# Patient Record
Sex: Male | Born: 1940 | Race: White | Hispanic: No | Marital: Married | Smoking: Former smoker
Health system: Southern US, Community
[De-identification: ages and names within clinical notes are randomized; demographics above are authoritative.]

## PROBLEM LIST (undated history)

## (undated) DIAGNOSIS — K279 Peptic ulcer, site unspecified, unspecified as acute or chronic, without hemorrhage or perforation: Secondary | ICD-10-CM

## (undated) DIAGNOSIS — K37 Unspecified appendicitis: Secondary | ICD-10-CM

## (undated) DIAGNOSIS — K649 Unspecified hemorrhoids: Secondary | ICD-10-CM

## (undated) DIAGNOSIS — I10 Essential (primary) hypertension: Secondary | ICD-10-CM

## (undated) DIAGNOSIS — N200 Calculus of kidney: Secondary | ICD-10-CM

## (undated) DIAGNOSIS — K5790 Diverticulosis of intestine, part unspecified, without perforation or abscess without bleeding: Secondary | ICD-10-CM

## (undated) DIAGNOSIS — H9190 Unspecified hearing loss, unspecified ear: Secondary | ICD-10-CM

## (undated) DIAGNOSIS — T7840XA Allergy, unspecified, initial encounter: Secondary | ICD-10-CM

## (undated) DIAGNOSIS — E785 Hyperlipidemia, unspecified: Secondary | ICD-10-CM

## (undated) DIAGNOSIS — R0600 Dyspnea, unspecified: Secondary | ICD-10-CM

## (undated) DIAGNOSIS — Z974 Presence of external hearing-aid: Secondary | ICD-10-CM

## (undated) DIAGNOSIS — K8020 Calculus of gallbladder without cholecystitis without obstruction: Secondary | ICD-10-CM

## (undated) DIAGNOSIS — I452 Bifascicular block: Secondary | ICD-10-CM

## (undated) DIAGNOSIS — K819 Cholecystitis, unspecified: Secondary | ICD-10-CM

## (undated) DIAGNOSIS — K449 Diaphragmatic hernia without obstruction or gangrene: Secondary | ICD-10-CM

## (undated) DIAGNOSIS — B9681 Helicobacter pylori [H. pylori] as the cause of diseases classified elsewhere: Secondary | ICD-10-CM

## (undated) DIAGNOSIS — K219 Gastro-esophageal reflux disease without esophagitis: Secondary | ICD-10-CM

## (undated) HISTORY — DX: Unspecified hemorrhoids: K64.9

## (undated) HISTORY — DX: Peptic ulcer, site unspecified, unspecified as acute or chronic, without hemorrhage or perforation: K27.9

## (undated) HISTORY — PX: APPENDECTOMY: SHX54

## (undated) HISTORY — DX: Calculus of gallbladder without cholecystitis without obstruction: K80.20

## (undated) HISTORY — PX: UMBILICAL HERNIA REPAIR: SHX196

## (undated) HISTORY — DX: Cholecystitis, unspecified: K81.9

## (undated) HISTORY — DX: Allergy, unspecified, initial encounter: T78.40XA

## (undated) HISTORY — PX: INGUINAL HERNIA REPAIR: SUR1180

## (undated) HISTORY — PX: KIDNEY STONE SURGERY: SHX686

## (undated) HISTORY — PX: CHOLECYSTECTOMY: SHX55

## (undated) HISTORY — DX: Essential (primary) hypertension: I10

## (undated) HISTORY — PX: COLONOSCOPY: SHX174

## (undated) HISTORY — DX: Calculus of kidney: N20.0

## (undated) HISTORY — DX: Helicobacter pylori (H. pylori) as the cause of diseases classified elsewhere: B96.81

## (undated) HISTORY — DX: Gastro-esophageal reflux disease without esophagitis: K21.9

## (undated) HISTORY — DX: Unspecified appendicitis: K37

## (undated) HISTORY — DX: Diaphragmatic hernia without obstruction or gangrene: K44.9

## (undated) HISTORY — DX: Diverticulosis of intestine, part unspecified, without perforation or abscess without bleeding: K57.90

## (undated) HISTORY — DX: Hyperlipidemia, unspecified: E78.5

---

## 1999-03-30 ENCOUNTER — Emergency Department (HOSPITAL_COMMUNITY): Admission: EM | Admit: 1999-03-30 | Discharge: 1999-03-30 | Payer: Self-pay | Admitting: Emergency Medicine

## 1999-03-30 ENCOUNTER — Encounter: Payer: Self-pay | Admitting: Emergency Medicine

## 1999-04-03 ENCOUNTER — Ambulatory Visit (HOSPITAL_COMMUNITY): Admission: RE | Admit: 1999-04-03 | Discharge: 1999-04-03 | Payer: Self-pay | Admitting: Urology

## 1999-04-03 ENCOUNTER — Encounter: Payer: Self-pay | Admitting: Urology

## 2000-02-03 ENCOUNTER — Other Ambulatory Visit: Admission: RE | Admit: 2000-02-03 | Discharge: 2000-02-03 | Payer: Self-pay | Admitting: Otolaryngology

## 2000-02-03 ENCOUNTER — Encounter (INDEPENDENT_AMBULATORY_CARE_PROVIDER_SITE_OTHER): Payer: Self-pay

## 2000-02-21 ENCOUNTER — Encounter: Admission: RE | Admit: 2000-02-21 | Discharge: 2000-02-21 | Payer: Self-pay | Admitting: Urology

## 2000-02-21 ENCOUNTER — Encounter: Payer: Self-pay | Admitting: Urology

## 2006-06-22 ENCOUNTER — Encounter: Admission: RE | Admit: 2006-06-22 | Discharge: 2006-06-22 | Payer: Self-pay | Admitting: Internal Medicine

## 2006-07-07 ENCOUNTER — Ambulatory Visit: Payer: Self-pay | Admitting: Internal Medicine

## 2006-07-29 ENCOUNTER — Ambulatory Visit: Payer: Self-pay | Admitting: Oncology

## 2006-08-06 LAB — COMPREHENSIVE METABOLIC PANEL
ALT: 26 U/L (ref 0–40)
Albumin: 4.2 g/dL (ref 3.5–5.2)
Alkaline Phosphatase: 86 U/L (ref 39–117)
Glucose, Bld: 127 mg/dL — ABNORMAL HIGH (ref 70–99)
Potassium: 4.3 mEq/L (ref 3.5–5.3)
Sodium: 139 mEq/L (ref 135–145)
Total Bilirubin: 0.8 mg/dL (ref 0.3–1.2)
Total Protein: 6.9 g/dL (ref 6.0–8.3)

## 2006-08-06 LAB — CBC WITH DIFFERENTIAL/PLATELET
Basophils Absolute: 0 10*3/uL (ref 0.0–0.1)
EOS%: 1.1 % (ref 0.0–7.0)
Eosinophils Absolute: 0.1 10*3/uL (ref 0.0–0.5)
HGB: 14.8 g/dL (ref 13.0–17.1)
MCH: 30.4 pg (ref 28.0–33.4)
MCV: 89.5 fL (ref 81.6–98.0)
MONO%: 7.8 % (ref 0.0–13.0)
NEUT#: 4.9 10*3/uL (ref 1.5–6.5)
RBC: 4.87 10*6/uL (ref 4.20–5.71)
RDW: 13.3 % (ref 11.2–14.6)
lymph#: 1.8 10*3/uL (ref 0.9–3.3)

## 2006-08-06 LAB — CHCC SMEAR

## 2006-08-07 ENCOUNTER — Ambulatory Visit (HOSPITAL_COMMUNITY): Admission: RE | Admit: 2006-08-07 | Discharge: 2006-08-07 | Payer: Self-pay | Admitting: Oncology

## 2006-08-11 ENCOUNTER — Ambulatory Visit: Payer: Self-pay | Admitting: Internal Medicine

## 2006-09-04 ENCOUNTER — Emergency Department (HOSPITAL_COMMUNITY): Admission: EM | Admit: 2006-09-04 | Discharge: 2006-09-05 | Payer: Self-pay | Admitting: Emergency Medicine

## 2006-09-11 LAB — CBC WITH DIFFERENTIAL/PLATELET
BASO%: 0.4 % (ref 0.0–2.0)
EOS%: 1.4 % (ref 0.0–7.0)
MCH: 30.8 pg (ref 28.0–33.4)
MCHC: 34.6 g/dL (ref 32.0–35.9)
MONO#: 0.5 10*3/uL (ref 0.1–0.9)
RBC: 4.8 10*6/uL (ref 4.20–5.71)
WBC: 7.1 10*3/uL (ref 4.0–10.0)
lymph#: 1.9 10*3/uL (ref 0.9–3.3)

## 2006-09-11 LAB — CHCC SMEAR

## 2006-10-07 ENCOUNTER — Encounter (INDEPENDENT_AMBULATORY_CARE_PROVIDER_SITE_OTHER): Payer: Self-pay | Admitting: Specialist

## 2006-10-07 ENCOUNTER — Ambulatory Visit (HOSPITAL_COMMUNITY): Admission: RE | Admit: 2006-10-07 | Discharge: 2006-10-07 | Payer: Self-pay | Admitting: General Surgery

## 2006-10-09 ENCOUNTER — Ambulatory Visit (HOSPITAL_COMMUNITY): Admission: RE | Admit: 2006-10-09 | Discharge: 2006-10-09 | Payer: Self-pay | Admitting: Gastroenterology

## 2007-08-30 ENCOUNTER — Ambulatory Visit (HOSPITAL_COMMUNITY): Admission: RE | Admit: 2007-08-30 | Discharge: 2007-08-30 | Payer: Self-pay | Admitting: Internal Medicine

## 2009-12-14 ENCOUNTER — Ambulatory Visit (HOSPITAL_BASED_OUTPATIENT_CLINIC_OR_DEPARTMENT_OTHER): Admission: RE | Admit: 2009-12-14 | Discharge: 2009-12-14 | Payer: Self-pay | Admitting: General Surgery

## 2010-09-18 ENCOUNTER — Ambulatory Visit (HOSPITAL_COMMUNITY): Admission: RE | Admit: 2010-09-18 | Discharge: 2010-09-20 | Payer: Self-pay | Admitting: General Surgery

## 2010-12-22 ENCOUNTER — Encounter: Payer: Self-pay | Admitting: Gastroenterology

## 2011-02-12 LAB — CBC
HCT: 44.1 % (ref 39.0–52.0)
Platelets: 230 10*3/uL (ref 150–400)
RDW: 13.2 % (ref 11.5–15.5)
WBC: 7.6 10*3/uL (ref 4.0–10.5)

## 2011-02-12 LAB — BASIC METABOLIC PANEL
BUN: 12 mg/dL (ref 6–23)
Calcium: 9.1 mg/dL (ref 8.4–10.5)
GFR calc non Af Amer: >60 mL/min (ref >60)
Potassium: 4.4 mEq/L (ref 3.5–5.1)

## 2011-02-12 LAB — SURGICAL PCR SCREEN
MRSA, PCR: NEGATIVE
Staphylococcus aureus: NEGATIVE

## 2011-02-16 LAB — POCT HEMOGLOBIN-HEMACUE: Hemoglobin: 15.8 g/dL (ref 13.0–17.0)

## 2011-02-16 LAB — BASIC METABOLIC PANEL
BUN: 11 mg/dL (ref 6–23)
Calcium: 9.2 mg/dL (ref 8.4–10.5)
Creatinine, Ser: 0.81 mg/dL (ref 0.4–1.5)
GFR calc Af Amer: >60 mL/min (ref >60)
GFR calc non Af Amer: >60 mL/min (ref >60)

## 2011-04-18 NOTE — Op Note (Signed)
NAME:  [name], [name]               ACCOUNT NO.:  [id number]   MEDICAL RECORD NO.:  [id number]          PATIENT TYPE:  AMB   LOCATION:  SDS                          FACILITY:  [facility]   PHYSICIAN:  Leonie Man, M.D.   DATE OF BIRTH:  Oct 24, 1941   DATE OF PROCEDURE:  10/07/2006  DATE OF DISCHARGE:                                 OPERATIVE REPORT   INCOMPLETE:   PREOPERATIVE DIAGNOSIS:  Chronic calculous cholecystitis.   POSTOPERATIVE DIAGNOSIS:  Chronic calculous cholecystitis.   PROCEDURE:  Laparoscopic cholecystectomy with intraoperative cholangiogram.   SURGEON:  Leonie Man, M.D.   ASSISTANT:  Anselm Pancoast. Zachery Dakins, M.D.   ANESTHESIA:  General.   SPECIMENS:  To the laboratory gallbladder with stones.   ESTIMATED BLOOD LOSS:  The estimated blood loss was minimal.   COMPLICATIONS:  None.  The patient returned to the PACU in excellent  condition.   INDICATIONS:  The patient is a 70 year old man presenting with upper  abdominal pain, associated with nausea and vomiting.  He presented through  the emergency room and underwent a complete cardiac workup which was  negative.  Subsequent gallbladder ultrasound showed a cholelithiasis.  His  liver function studies at that time were moderately elevated.  They have  since decreased towards normal; however, they are still not completely  normal.  His most recent laboratory evaluations show an elevation in his  SGOT to 39, SGPT is normal, alkaline phosphatase is 119 and total bilirubin  is 1.3.  Since his original attack, the patient has not had any other  symptoms of gallbladder disease.  He comes to the operating room now for a  laparoscopic cholecystectomy and intraoperative cholangiogram.  The risks  and potential benefits of surgery have been fully discussed with him and all  questions answered.  A consent obtained.      Leonie Man, M.D.  Electronically Signed     PB/MEDQ  D:  10/07/2006  T:  10/07/2006  Job:   [id number]

## 2011-04-18 NOTE — Consult Note (Signed)
NAME:  [name], [name]               ACCOUNT NO.:  [id number]   MEDICAL RECORD NO.:  [id number]          PATIENT TYPE:  AMB   LOCATION:  SDS                          FACILITY:  [facility]   PHYSICIAN:  Jordan Hawks. Elnoria Howard, MD    DATE OF BIRTH:  June 29, 1941   DATE OF CONSULTATION:  10/07/2006  DATE OF DISCHARGE:  10/07/2006                                 CONSULTATION   REFERRING PHYSICIAN:  Leonie Man, M.D.   HISTORY OF PRESENT ILLNESS:  This is a 70 year old gentleman who is  status post laparoscopic cholecystectomy on October 07, 2006, for  symptomatic cholelithiasis. The patient previously presented to the  emergency room and in October 2007 with complaints of epigastric pain  with radiation to his right shoulder. At that time, his liver panel was  noted to have an elevation in his AST at 279, ALT at 246 and alkaline  phosphatase of 125.  Additionally, his bilirubin was elevated at 2.1.  Since that event in the emergency room, he has not had any further  evidence of abdominal pain. An ultrasound was performed while he was in  the emergency, and there was no evidence of acute cholecystitis at that  time. However, it was consistent with cholelithiasis. He subsequently  underwent an uncomplicated laparoscopic cholecystectomy by Dr. Lurene Shadow,  and he is currently recovering at this time.  During the procedure, an  intraoperative cholangiogram was performed and did reveal a dilated CBD  as well as some sludge and a possible filling defect in the distal CBD.  There was contrast that did not extrude out into the duodenum which is  normal.  Subsequently, a GI consultation was requested for further  evaluation and treatment.   PAST MEDICAL HISTORY:  Is significant for:  1. Hypertension.  2. Hyperlipidemia.   ALLERGIES:  Are to PENICILLIN.   PRIOR SURGERIES:  1. Appendectomy.  2. Renal stones.  3. Hernia repair.   FAMILY HISTORY:  Is noncontributory.   SOCIAL HISTORY:  Is negative for  alcohol, tobacco or illicit drug use.  The patient is married.   REVIEW OF SYSTEMS:  Negative for nausea, vomiting, fevers, chills,  dysphagia, dysarthria, shortness of breath, chest pain, arthritis,  arthralgias, urinary difficulties, diarrhea, constipation, dizziness,  vertigo.   MEDICATIONS:  Benicar, Effexor, fenadine, and simvastatin.   PHYSICAL EXAMINATION:  VITAL SIGNS:  Stable.  GENERAL:  The patient is in no acute distress, alert and oriented.  HEENT:  Normocephalic, atraumatic.  Extraocular muscles intact.  Pupils  equal, round, reactive to light.  LUNGS:  Clear to auscultation bilaterally.  CARDIOVASCULAR:  Regular rate and rhythm.  ABDOMEN: Flat, soft, tender at the incision sites.  EXTREMITIES:  No clubbing, cyanosis or edema.   LABORATORY VALUES:  On October 02, 2006, white blood cell count is 7.2,  hemoglobin 15.4, MCV  90.4, platelets at 389. PT is 13.9, INR 1.9, PTT  33.  Sodium 139, potassium 5.2, chloride 103, CO2 29, glucose 79, BUN  11, creatinine 1.1, total bilirubin 1.3, alkaline phosphatase 119, AST  39, ALT 30, albumin 4.1.   IMPRESSION:  1. Probable  choledocholithiasis.  2. Status post laparoscopic cholecystectomy.  3. Hypertension.  4. Recent history of thrombocytopenia which appears to be stable at      this time.   After evaluation of the patient, it does appear that his transaminases  are improving and almost normalized. At this time, I do not feel that  there is an emergent need for an ERCP as the risks would outweigh the  benefit. There is a probable stone in the distal CBD, and it appears to  be small, and it may be that he can pass this stone spontaneously  without having to undergo an ERCP procedure.   At this time, I will have him follow up in the office tomorrow for check  of his liver panel. If  there is a persistent elevation in his bilirubin  and alkaline phosphatase, I will proceed with ERCP on Friday. If there  is normalization or  continued trend towards normalization, I will hold  off on ERCP and have the patient follow up at a later date in the office  as well as a right upper quadrant ultrasound to discern if there is any  continued dilation of his common bile duct.  I have informed the patient  to contact me should he have any recurrence or worsening of his  epigastric pain, nausea, vomiting and certainly fever of greater than  101.      Jordan Hawks Elnoria Howard, MD  Electronically Signed     PDH/MEDQ  D:  10/07/2006  T:  10/07/2006  Job:  [id number]   cc:   Leonie Man, M.D.  Larina Earthly, M.D.

## 2011-04-18 NOTE — Assessment & Plan Note (Signed)
[facility]                           GASTROENTEROLOGY OFFICE NOTE   NAME:[name], [name]                      MRN:          [id number]  DATE:07/07/2006                            DOB:          [birth year]    REFERRING PHYSICIAN:  Larina Earthly, MD   REASON FOR CONSULTATION:  Abdominal pain.   HISTORY:  This is a 70 year old white male with a history of hyperlipidemia  who is referred through the courtesy of Dr. Felipa Eth regarding abdominal pain.  The patient reports having developed problems with epigastric pain  approximately 3 weeks ago.  He described the pain as occurring only at  night. He described indigestion.  It was most prominent in the epigastric  region with some radiation bilaterally in the upper portion of the abdomen.  Occasional pyrosis was present.  No nausea, vomiting, or dysphagia.  He  seemed to be OK in the morning and during the day.  It was not clear that  food affected his discomfort, though he would not eat at night after  developing the pain as he was lying down to rest.  He was evaluated in Dr.  Vicente Males office and had blood work.  CBC was normal except for mildly  diminished platelets.  Comprehensive metabolic panel was normal including  liver function tests.  Amylase and Lipase were normal.  He did, however,  have a positive Helicobacter pylori antibody for which he was treated with  b.i.d. Biaxin, Flagyl, and Protonix for 2 weeks.  After completing therapy  the patient reports that his symptoms have entirely resolved.  His only  active GI complaint now was occasional rectal itching with some minor rectal  bleeding last noticed a year ago.  He does report a remote history of ulcers  diagnosed by symptoms about 20 years ago.  He has not had prior endoscopy or  screening colonoscopy.  The latter was recommended by his physician.   PAST MEDICAL HISTORY:  1.  Hyperlipidemia.  2.  Remote history of ulcers.  3.  Kidney stones.   PAST SURGICAL HISTORY:  1.  Left inguinal hernia surgery.  2.  Appendectomy for ruptured appendix 10 years ago.   ALLERGIES:  PENICILLIN.   CURRENT MEDICATIONS:  1.  Aspirin 81 mg daily.  2.  Benicar 40 mg daily.  3  Allegra 180 mg daily.  1.  Simvastatin 40 mg daily.  2.  Ibuprofen p.r.n.   FAMILY HISTORY:  No family history of gastrointestinal malignancy.   SOCIAL HISTORY:  The patient is married with 2 children; lives with his  wife.  He is retired form the [employer] as well as [employer].  He does not smoke or use alcohol.   REVIEW OF SYSTEMS:  Per diagnostic evaluation form.   PHYSICAL EXAMINATION:  GENERAL:  A well-appearing male in no acute distress.  VITAL SIGNS:  Blood pressure 124/70, heart rate 68.  Weight is 205 pounds.  He is 5 feet 10 inches in height.  HEENT:  Sclerae anicteric.  Conjunctivae are pink.  Oral mucosa is intact.  NECK:  No adenopathy.  LUNGS:  Clear.  HEART:  Regular.  ABDOMEN:  Obese and soft without tenderness, mass or obvious hernia.  Good  bowel sounds are heard.  EXTREMITIES:  Without edema.   IMPRESSION:  1.  This is a 70 year old gentleman with recent problems with epigastric      pain.  Symptoms resolved after treatment for Helicobacter pylori.  Given      his remote history of ulcers, suspect recurrence of the same.  2.  Colon cancer screening.  The patient is baseline risk.  He is an      appropriate candidate without contraindication.   RECOMMENDATIONS:  Colonoscopy for colon cancer screening and upper endoscopy  to evaluate epigastric pain and probable ulcer.  The nature of the procedure  as well as the risks, benefits, and alternatives were discussed in detail.  He understood and agreed to proceed.  If his endoscopy is unrevealing we  should perform biopsies to assure irradiation of H. pylori.                                   [name]. Eda Keys., MD   JNP/MedQ  DD:  07/08/2006  DT:  07/08/2006  Job #:   [id number]   cc:   Larina Earthly, MD

## 2011-04-18 NOTE — Op Note (Signed)
NAME:  [name], [name]               ACCOUNT NO.:  [id number]   MEDICAL RECORD NO.:  [id number]          PATIENT TYPE:  AMB   LOCATION:  SDS                          FACILITY:  [facility]   PHYSICIAN:  Leonie Man, M.D.   DATE OF BIRTH:  [birth year]   DATE OF PROCEDURE:  10/07/2006  DATE OF DISCHARGE:                                 OPERATIVE REPORT   PREOPERATIVE DIAGNOSIS:  Chronic calculous cholecystitis.   POSTOPERATIVE DIAGNOSIS:  Chronic calculous cholecystitis with  choledocholithiasis.   PROCEDURE:  Laparoscopic cholecystectomy with intraoperative cholangiogram   SURGEON:  Leonie Man, M.D.   ASSISTANT:  Anselm Pancoast. Zachery Dakins, M.D.   ANESTHESIA:  General.   SPECIMENS TO THE LAB:  Gallbladder with stones.   ESTIMATED BLOOD LOSS:  Minimal.   COMPLICATIONS:  None.   The patient returned to the PACU in excellent condition.   INDICATIONS FOR SURGERY:  The patient is a 70 year old gentleman who  originally presented through the emergency room with severe epigastric pain  which radiated to his right shoulder and back.  This associated with nausea  and vomiting.  He underwent a cardiac workup which was negative.  Abdominal  ultrasound demonstrated cholelithiasis.  His liver function studies at the  time of evaluation showed a total bilirubin of 2.1 and alkaline phosphatase  of 125.  His SGOT and SGPT where 279 and 246, respectively.  Since the time  of his first attack he has not had any continued gallbladder symptoms.  His  liver function studies have returned toward normal with a SGOT of 39, SGPT  of 30, alkaline phosphatase 119, bilirubin is 1.3.  The patient scheduled  and brought to the operating room now for laparoscopic cholecystectomy with  intraoperative cholangiogram.  The risks and potential benefits of surgery  have been fully discussed.  All questions answered and consent obtained for  surgery.   Following induction of satisfactory general anesthesia, with the  patient  positioned supinely, the abdomen is routinely prepped and draped to be  included in a sterile operative field.  Open laparoscopy is created at the  umbilicus with insertion of a Hassan cannula, insufflation of the peritoneal  cavity to 14 mmHg pressure.  This was done with carbon dioxide.  Camera was  inserted and visual exploration of the abdomen carried out.  There were  multiple adhesions in the lower abdomen from previous appendectomy.  In the  upper abdomen the gallbladder was noted to be chronically scarred with  multiple adhesions to the wall of the gallbladder, anterior gastric wall and  duodenal sweep were somewhat tethered to the gallbladder.  Liver edges were  sharp.  Liver surface was smooth.  The small or large intestine visualized  and appeared to be otherwise abnormal.   Under direct vision, epigastric and lateral ports were placed.  The  gallbladder was grasped and retracted cephalad and dissection carried out so  as to remove the adhesions from the gallbladder, carrying the dissection  down to the ampulla.  In the region of the ampulla the cystic artery and  cystic duct were dissected free and visualized.  The cystic duct was traced  up to its entry into the gallbladder wall and the cystic artery dissected  free and traced up to the gallbladder also.  The cystic arteries were then  doubly clipped and transected.  The cystic duct was clipped proximally and  opened.  The cystic duct cholangiogram was accomplished by passing a Cook  catheter into the abdomen and into the cystic duct through which a one half-  strength Hypaque was injected under fluoroscopic control.  The resulting  cholangiogram showed prompt flow of contrast into the duodenum, however, at  the distal common bile duct there appeared to be some sludge and debris.  There was  normal tapering of the distal common bile duct.  The cystic duct  catheter was then removed and the cystic duct was triply  clipped and  transected.  The gallbladder was dissected free from the liver bed using  electrocautery and maintaining hemostasis throughout the entire course of  the dissection.  At the end of dissection, the gallbladder was placed on  Endopouch and retrieved through the umbilical wound without difficulty.  All  areas of dissection were checked for hemostasis and noted to be dry.  Additional bleeding points within the visual bed were treated with  electrocautery.  The right upper quadrant was then thoroughly irrigated with  normal saline and aspirated.  Sponge, instrument and sharp counts were  verified.  Pneumoperitoneum was allowed to deflate after the trocars were  removed under direct vision.  Wounds were closed in layers as follows.  The  umbilical wound in two layers with 0 Dexon and 4-0 Monocryl, epigastric and  flank wounds were closed with 4-0 Monocryl sutures, all incisions were then  reinforced with Steri-Strips.  Sterile dressings were applied.  The  anesthetic reversed and the patient removed from the operating room to the  recovery room in stable condition.  He tolerated the procedure well.      Leonie Man, M.D.  Electronically Signed     PB/MEDQ  D:  10/07/2006  T:  10/07/2006  Job:  [id number]   cc:   Larina Earthly, M.D.

## 2011-11-28 ENCOUNTER — Other Ambulatory Visit: Payer: Self-pay | Admitting: Dermatology

## 2012-08-09 ENCOUNTER — Emergency Department (HOSPITAL_COMMUNITY)
Admission: EM | Admit: 2012-08-09 | Discharge: 2012-08-09 | Disposition: A | Discharge status: Home / Self Care | Payer: Medicare Other | Attending: Emergency Medicine | Admitting: Emergency Medicine

## 2012-08-09 ENCOUNTER — Encounter (HOSPITAL_COMMUNITY): Payer: Self-pay | Admitting: Physical Medicine and Rehabilitation

## 2012-08-09 DIAGNOSIS — IMO0002 Reserved for concepts with insufficient information to code with codable children: Secondary | ICD-10-CM

## 2012-08-09 DIAGNOSIS — S61509A Unspecified open wound of unspecified wrist, initial encounter: Secondary | ICD-10-CM | POA: Insufficient documentation

## 2012-08-09 DIAGNOSIS — Z880 Allergy status to penicillin: Secondary | ICD-10-CM | POA: Insufficient documentation

## 2012-08-09 DIAGNOSIS — W268XXA Contact with other sharp object(s), not elsewhere classified, initial encounter: Secondary | ICD-10-CM | POA: Insufficient documentation

## 2012-08-09 DIAGNOSIS — Z23 Encounter for immunization: Secondary | ICD-10-CM | POA: Insufficient documentation

## 2012-08-09 MED ORDER — BACITRACIN 500 UNIT/GM EX OINT: 1.0000 "application " | TOPICAL_OINTMENT | Freq: Two times a day (BID) | CUTANEOUS | Status: DC

## 2012-08-09 MED ORDER — TETANUS-DIPHTH-ACELL PERTUSSIS 5-2.5-18.5 LF-MCG/0.5 IM SUSP
0.5000 mL | Freq: Once | INTRAMUSCULAR | Status: AC
  Administered 2012-08-09: 0.5 mL via INTRAMUSCULAR
  Filled 2012-08-09 (×2): qty 0.5

## 2012-08-09 NOTE — ED Provider Notes (Signed)
History  This chart was scribed for Doug Sou, MD  by Albertha Ghee Rifaie. This patient was seen in room [facility] and the patient's care was started at 4:45PM  CSN: [id number]  Arrival date & time 08/09/12  1430   None     Chief Complaint  Patient presents with  . Laceration     The history is provided by the patient. No language interpreter was used.    [name] is a 71 y.o. male who presents to the Emergency Department complaining of lacerations to both wrists that occurred when a piece of sheet metal fell while he was [place] the woods about 30 minutes PTA.  He denies any pain currently. He denies fevers, nausea and chills as associated symptoms. TD is not UTD. Pt does not have a h/o chronic medical conditions. He denies smoking and alcohol use.  No past medical history on file. Hypertension No past surgical history on file.  History reviewed. No pertinent family history.  History  Substance Use Topics  . Smoking status: Never Smoker   . Smokeless tobacco: Not on file  . Alcohol Use: No      Review of Systems  Constitutional: Negative.   HENT: Negative.   Respiratory: Negative.   Cardiovascular: Negative.   Gastrointestinal: Negative.   Musculoskeletal: Negative.   Skin: Positive for wound.       Lacerations to right hand, abrasions to left forearm  Neurological: Negative.   Hematological: Negative.   Psychiatric/Behavioral: Negative.     Allergies  Penicillins  Home Medications   Current Outpatient Rx  Name Route Sig Dispense Refill  . VITAMIN C PO Oral Take 1 tablet by mouth daily.    . ASPIRIN EC 81 MG PO TBEC Oral Take 81 mg by mouth daily.    Marland Kitchen VITAMIN D PO Oral Take 1 tablet by mouth daily.    Marland Kitchen FLUTICASONE PROPIONATE 50 MCG/ACT NA SUSP Nasal Place 2 sprays into the nose daily as needed. For allergies.    Marland Kitchen OLMESARTAN MEDOXOMIL 20 MG PO TABS Oral Take 20 mg by mouth daily.    Marland Kitchen SIMVASTATIN 20 MG PO TABS Oral Take 20 mg by mouth every  evening.      Triage Vitals: BP 137/78  Pulse 116  Temp 98.3 F (36.8 C) (Oral)  Resp 16  SpO2 98%  Physical Exam  Nursing note and vitals reviewed. Constitutional: He appears well-developed and well-nourished.  HENT:  Head: Normocephalic and atraumatic.  Eyes: Conjunctivae are normal. Pupils are equal, round, and reactive to light.  Neck: Neck supple. No tracheal deviation present. No thyromegaly present.  Cardiovascular: Normal rate and regular rhythm.   No murmur heard. Pulmonary/Chest: Effort normal and breath sounds normal.  Abdominal: Soft. Bowel sounds are normal. He exhibits no distension. There is no tenderness.  Musculoskeletal: Normal range of motion. He exhibits no edema and no tenderness.       neurovascularly intact  Neurological: He is alert. Coordination normal.  Skin: Skin is warm and dry. No rash noted.       4 cm laceration to the dorsum of the right hand, dermis layer at the base, 2 cm laceration to the dorsum of the right hand, epidermis at base, 2 cm linear abrasion at the left middle arm, dime sized at the left dorsal wrist  Psychiatric: He has a normal mood and affect.    ED Course  Procedures (including critical care time)  DIAGNOSTIC STUDIES: Oxygen Saturation is 98% on room  air, normal by my interpretation.    COORDINATION OF CARE: 4:56PM-Discussed treatment plan with pt at bedside and pt agreed to plan.  LACERATION REPAIR Performed by: me Consent: Verbal consent obtained. Risks and benefits: risks, benefits and alternatives were discussed Patient identity confirmed: provided demographic data Time out performed prior to procedure Prepped and Draped in normal sterile fashion Wound explored Laceration Location: right wrist Laceration Length: 4cm No Foreign Bodies seen or palpated Anesthesia: local infiltration Local anesthetic: lidocaine 2% without epinephrine Anesthetic total: 2 ml Irrigation method: syringe Amount of cleaning:  standard Skin closure: 4-0 Prolene  Number of sutures or staples: For  Technique: Simple interrupted sutures  Patient tolerance: Patient tolerated the procedure well with no immediate complications.  5:10PM-Discussed discharge plan with pt at bedside and pt agreed to plan.  Labs Reviewed - No data to display No results found.   No diagnosis found.    MDM  2 cm laceration of dorsum of right hand did not require repair. Abrasions and 2 cm laceration right hand require local wound care only Plan bacitracin ointment wound checks 2 days sutures out in 7 days         I personally performed the services described in this documentation, which was scribed in my presence. The recorded information has been reviewed and considered.   Doug Sou, MD 08/09/12 1730

## 2012-08-09 NOTE — ED Notes (Signed)
Pt presents to department for evaluation of laceration. States he was building deer stand earlier when metal slipped in his hand. 2 inch laceration noted to R hand, (2) small abrasions noted to L hand and arm. Bleeding controlled. Tetanus unknown. He is alert and oriented x4. No signs of distress noted at the time.

## 2012-08-11 ENCOUNTER — Encounter (HOSPITAL_COMMUNITY): Payer: Self-pay

## 2012-08-11 ENCOUNTER — Emergency Department (HOSPITAL_COMMUNITY)
Admission: EM | Admit: 2012-08-11 | Discharge: 2012-08-11 | Disposition: A | Discharge status: Home / Self Care | Payer: Medicare Other | Source: Home / Self Care | Attending: Emergency Medicine | Admitting: Emergency Medicine

## 2012-08-11 DIAGNOSIS — Z5189 Encounter for other specified aftercare: Secondary | ICD-10-CM

## 2012-08-11 NOTE — ED Provider Notes (Signed)
History     CSN: [id number]  Arrival date & time 08/11/12  4098   First MD Initiated Contact with Patient 08/11/12 [phone]      Chief Complaint  Patient presents with  . Wound Check    (Consider location/radiation/quality/duration/timing/severity/associated sxs/prior treatment) HPI Comments: received laceration to dorsum of the L hand 2 d ago and treated/sutured in the ED. He received a TT inj.  Has no complaints, no pain or systemic sx's. Wound healing well, has been applying A and D oint.  Edges well approximated. No redness puffiness, lymphangitis.   Patient is a 71 y.o. male presenting with wound check.  Wound Check     History reviewed. No pertinent past medical history.  History reviewed. No pertinent past surgical history.  History reviewed. No pertinent family history.  History  Substance Use Topics  . Smoking status: Never Smoker   . Smokeless tobacco: Not on file  . Alcohol Use: No      Review of Systems  All other systems reviewed and are negative.    Allergies  Penicillins  Home Medications   Current Outpatient Rx  Name Route Sig Dispense Refill  . VITAMIN C PO Oral Take 1 tablet by mouth daily.    . ASPIRIN EC 81 MG PO TBEC Oral Take 81 mg by mouth daily.    Marland Kitchen VITAMIN D PO Oral Take 1 tablet by mouth daily.    Marland Kitchen FLUTICASONE PROPIONATE 50 MCG/ACT NA SUSP Nasal Place 2 sprays into the nose daily as needed. For allergies.    Marland Kitchen OLMESARTAN MEDOXOMIL 20 MG PO TABS Oral Take 20 mg by mouth daily.    Marland Kitchen SIMVASTATIN 20 MG PO TABS Oral Take 20 mg by mouth every evening.      BP 122/82  Pulse 87  Temp 98.9 F (37.2 C) (Oral)  Resp 16  SpO2 96%  Physical Exam  Constitutional: He is oriented to person, place, and time. He appears well-developed and well-nourished.  Neurological: He is alert and oriented to person, place, and time.  Skin: Skin is warm and dry. No rash noted. No erythema.       See HPI for wound description    ED Course  Procedures  (including critical care time)  Labs Reviewed - No data to display No results found.   1. Visit for wound check       MDM  No further treatment necessary. Do not apply any more ointment, keep clean and dry, cover if in dirty environment   . Suture reoval here in 5 d.         Hayden Rasmussen, NP 08/11/12 1040

## 2012-08-11 NOTE — ED Notes (Signed)
Wound check only

## 2012-08-11 NOTE — ED Provider Notes (Signed)
Medical screening examination/treatment/procedure(s) were performed by non-physician practitioner and as supervising physician I was immediately available for consultation/collaboration.  Raynald Blend, MD 08/11/12 1139

## 2012-10-18 ENCOUNTER — Encounter: Payer: Self-pay | Admitting: Internal Medicine

## 2012-11-15 ENCOUNTER — Encounter: Payer: Self-pay | Admitting: Internal Medicine

## 2012-11-15 ENCOUNTER — Ambulatory Visit (INDEPENDENT_AMBULATORY_CARE_PROVIDER_SITE_OTHER): Payer: Medicare Other | Admitting: Internal Medicine

## 2012-11-15 VITALS — BP 122/70 | HR 80 | Ht 70.0 in | Wt 205.5 lb

## 2012-11-15 DIAGNOSIS — K5900 Constipation, unspecified: Secondary | ICD-10-CM

## 2012-11-15 DIAGNOSIS — K5730 Diverticulosis of large intestine without perforation or abscess without bleeding: Secondary | ICD-10-CM

## 2012-11-15 DIAGNOSIS — R195 Other fecal abnormalities: Secondary | ICD-10-CM

## 2012-11-15 DIAGNOSIS — K219 Gastro-esophageal reflux disease without esophagitis: Secondary | ICD-10-CM

## 2012-11-15 NOTE — Progress Notes (Addendum)
HISTORY OF PRESENT ILLNESS:  [name] is a 71 y.o. male with hyperlipidemia and multiple prior abdominal surgeries who presents today regarding Hemoccult-positive stool. The patient previously underwent colonoscopy and upper endoscopy (screening, abdominal pain respectively) in September 2007. Colonoscopy revealed diverticulosis. Upper endoscopy revealed a hiatal hernia. The patient's recent Hemoccult positive stool was dated 10/14/2012. This was part of routine annual screening/annual evaluation. Review of additional outside laboratories finds normal Hemoccult study the year previous. Laboratories from November 2013 with normal hemoglobin of 16.3.  Patient takes Nexium 40 mg daily for GERD. He does have occasional breakthrough symptoms at night after eating sweets. He will take on demand antacids with relief. No dysphagia. Occasional gas and bloating. 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 No melena or hematochezia. No weight loss. No abdominal pain. He does take baby aspirin daily, but no NSAIDs. His chronic medical problems are stable  REVIEW OF SYSTEMS:  All non-GI ROS negative except for sinus and allergy trouble, hearing problems, muscle cramps  Past Medical History  Diagnosis Date  . Diverticulosis   . Hiatal hernia   . Gallstones   . Kidney stones   . Hyperlipidemia   . Appendicitis   . H pylori ulcer   . Cholecystitis   . Cholelithiasis   . GERD (gastroesophageal reflux disease)   . Hemorrhoids     Past Surgical History  Procedure Date  . Umbilical hernia repair   . Appendectomy   . Cholecystectomy   . Inguinal hernia repair     left    Social History [name]  reports that he quit smoking about 33 years ago. His smoking use included Cigarettes. He quit smokeless tobacco use about 36 years ago. He reports that he drinks alcohol. He reports that he does not use illicit drugs.  family history includes CAD in his mother; CVA in his mother and  unspecified family member; Diabetes in his father, mother, and other; Fibromyalgia in his daughter; Heart attack in his brother; Hypertension in his mother and son; Stomach cancer in his paternal grandfather; and Stroke in his maternal grandmother and paternal grandmother.  Allergies  Allergen Reactions  . Penicillins Rash       PHYSICAL EXAMINATION: Vital signs: BP 122/70  Pulse 80  Ht 5\' 10"  (1.778 m)  Wt 205 lb 8 oz (93.214 kg)  BMI 29.49 kg/m2  Constitutional: generally well-appearing, no acute distress Psychiatric: alert and oriented x3, cooperative Eyes: extraocular movements intact, anicteric, conjunctiva pink Mouth: oral pharynx moist, no lesions Ears: Hearing aid Neck: supple no lymphadenopathy Cardiovascular: heart regular rate and rhythm, no murmur Lungs: clear to auscultation bilaterally Abdomen: soft, nontender, nondistended, no obvious ascites, no peritoneal signs, normal bowel sounds, no organomegaly. prior surgical incisions well-healed Rectal:deferred until colonoscopy Extremities: no lower extremity edema bilaterally Skin: no lesions on visible extremities Neuro: No focal deficits.   ASSESSMENT:  #1. Hemoccult-positive stool. No alarm features. Normal hemoglobin #2. GERD with occasional breakthrough. Prior endoscopy in 2007 with hiatal hernia #3. Prior colonoscopy 2007 with diverticulosis #4. Constipation. Likely functional   PLAN:  #1. Colonoscopy and upper endoscopy to evaluate Hemoccult-positive stool.The nature of the procedure, as well as the risks, benefits, and alternatives were carefully and thoroughly reviewed with the patient. Ample time for discussion and questions allowed. The patient understood, was satisfied, and agreed to proceed.  #2. Movi prep prescribed. The patient instructed on its use #3. Reflux precautions  #4. Continue PPI. Optimal timing 30 minutes prior to first meal. Okay to  use antacids for breakthrough symptoms. #5. Fiber, stool  softeners, and water for constipation

## 2012-11-15 NOTE — Patient Instructions (Addendum)
I will call you to schedule your endoscopy/colonoscopy as soon as I have the newest schedule

## 2013-01-11 ENCOUNTER — Telehealth: Payer: Self-pay

## 2013-01-11 NOTE — Telephone Encounter (Signed)
Called patient to schedule colonoscopy per last office visit.  Left message for patient to call me back

## 2013-01-12 ENCOUNTER — Telehealth: Payer: Self-pay

## 2013-01-12 NOTE — Telephone Encounter (Signed)
Dr. Marina Goodell requested an endo/colon for patient at 11-15-12 office visit;  At that time, his newest schedule was not out.  Called and spoke with patient's wife to schedule procedure now that there were openings.  Scheduled endo/colon and previsit with patient's wife.  Wife acknowledged and understood both appointments.

## 2013-01-19 ENCOUNTER — Ambulatory Visit (AMBULATORY_SURGERY_CENTER): Payer: Medicare Other | Admitting: *Deleted

## 2013-01-19 VITALS — Ht 71.0 in | Wt 205.4 lb

## 2013-01-19 DIAGNOSIS — K921 Melena: Secondary | ICD-10-CM

## 2013-01-19 DIAGNOSIS — K219 Gastro-esophageal reflux disease without esophagitis: Secondary | ICD-10-CM

## 2013-01-19 MED ORDER — MOVIPREP 100 G PO SOLR: 1.0000 | Freq: Once | ORAL | Status: DC

## 2013-01-19 NOTE — Progress Notes (Signed)
No egg or soy allergy. No problems with sedation with previous procedures. ewm

## 2013-01-27 ENCOUNTER — Encounter: Payer: Self-pay | Admitting: Internal Medicine

## 2013-01-27 ENCOUNTER — Ambulatory Visit (AMBULATORY_SURGERY_CENTER): Payer: Medicare Other | Admitting: Internal Medicine

## 2013-01-27 VITALS — BP 99/69 | HR 77 | Temp 98.8°F | Resp 10 | Ht 71.0 in | Wt 205.0 lb

## 2013-01-27 DIAGNOSIS — D126 Benign neoplasm of colon, unspecified: Secondary | ICD-10-CM

## 2013-01-27 DIAGNOSIS — K5730 Diverticulosis of large intestine without perforation or abscess without bleeding: Secondary | ICD-10-CM

## 2013-01-27 DIAGNOSIS — K921 Melena: Secondary | ICD-10-CM

## 2013-01-27 DIAGNOSIS — K219 Gastro-esophageal reflux disease without esophagitis: Secondary | ICD-10-CM

## 2013-01-27 MED ORDER — SODIUM CHLORIDE 0.9 % IV SOLN: 500.0000 mL | INTRAVENOUS | Status: DC

## 2013-01-27 NOTE — Op Note (Signed)
[facility] [address] [place], [address]   COLONOSCOPY PROCEDURE REPORT  PATIENT: Eric, Luna  MR#: [id number] BIRTHDATE: 1941/06/27 , 71  yrs. old GENDER: Male ENDOSCOPIST: Roxy Cedar, MD REFERRED AO:[id number] Avva, M.D. PROCEDURE DATE:  01/27/2013 PROCEDURE:   Colonoscopy with snare polypectomy    x3 ASA CLASS:   Class II INDICATIONS:heme-positive stool.   Prior exam in 2007 revealed diverticulosis only MEDICATIONS: MAC sedation, administered by CRNA and propofol (Diprivan) 200mg  IV  DESCRIPTION OF PROCEDURE:   After the risks benefits and alternatives of the procedure were thoroughly explained, informed consent was obtained.  A digital rectal exam revealed no abnormalities of the rectum.   The [facility] PCF-H180AL [id number]  endoscope was introduced through the anus and advanced to the cecum, which was identified by both the appendix and ileocecal valve. No adverse events experienced.   The quality of the prep was adequate, using MoviPrep  The instrument was then slowly withdrawn as the colon was fully examined.      COLON FINDINGS: The mucosa appeared normal in the terminal ileum. Three polyps ranging between 3-69mm in size were found at the cecum, in the ascending colon, and transverse colon.  A polypectomy was performed with a cold snare.  The resection was complete and the polyp tissue was completely retrieved.   Severe diverticulosis was noted The finding was in the left colon.   The colon mucosa was otherwise normal.  Retroflexed views revealed no abnormalities. The time to cecum=3 minutes 44 seconds.  Withdrawal time=13 minutes 07 seconds.  The scope was withdrawn and the procedure completed. COMPLICATIONS: There were no complications.  ENDOSCOPIC IMPRESSION: 1.   Normal mucosa in the terminal ileum 2.   Three polyps ranging between 3-75mm in size were found at the cecum, in the ascending colon, and transverse colon; polypectomy was  performed with a cold snare 3.   Severe diverticulosis was noted in the left colon 4.   The colon mucosa was otherwise normal  RECOMMENDATIONS: 1.  Follow up colonoscopy in 5 years 2.  Upper endoscopy today (see report)   eSigned:  Roxy Cedar, MD 01/27/2013 3:48 PM   cc: Chilton Greathouse, MD and The Patient   PATIENT NAME:  Eric, Luna MR#: [id number]

## 2013-01-27 NOTE — Op Note (Signed)
 [facility] [address] [place], [address]   ENDOSCOPY PROCEDURE REPORT  PATIENT: [name], [name]  MR#: [id number] BIRTHDATE: 1941/07/21 , 71  yrs. old GENDER: Male ENDOSCOPIST: Roxy Cedar, MD REFERRED BY:  Chilton Greathouse, M.D. PROCEDURE DATE:  01/27/2013 PROCEDURE:  EGD, diagnostic ASA CLASS:     Class II INDICATIONS:  Occult blood positive.   history of esophageal reflux.  MEDICATIONS: MAC sedation, administered by CRNA and propofol (Diprivan) 50mg  IV TOPICAL ANESTHETIC: none  DESCRIPTION OF PROCEDURE: After the risks benefits and alternatives of the procedure were thoroughly explained, informed consent was obtained.  The [facility] GIF-H180 [id number] endoscope was introduced through the mouth and advanced to the second portion of the duodenum. Without limitations.  The instrument was slowly withdrawn as the mucosa was fully examined.      The upper, middle and distal third of the esophagus were carefully inspected and no abnormalities were noted.  The z-line was well seen at the GEJ.  The endoscope was pushed into the fundus which was normal including a retroflexed view.  The antrum, gastric body, first and second part of the duodenum were unremarkable. Retroflexed views revealed a hiatal hernia.     The scope was then withdrawn from the patient and the procedure completed.  COMPLICATIONS: There were no complications. ENDOSCOPIC IMPRESSION: 1. Normal EGD 2. GERD  RECOMMENDATIONS: 1.  Anti-reflux regimen to be followed 2.  Continue PPI  REPEAT EXAM:  eSigned:  Roxy Cedar, MD 01/27/2013 3:51 PM   NW:[id number] Avva, MD and The Patient

## 2013-01-27 NOTE — Progress Notes (Signed)
Lidocaine-40mg IV prior to Propofol InductionPropofol given over incremental dosages 

## 2013-01-27 NOTE — Progress Notes (Signed)
Called to room to assist during endoscopic procedure.  Patient ID and intended procedure confirmed with present staff. Received instructions for my participation in the procedure from the performing physician.  

## 2013-01-27 NOTE — Progress Notes (Signed)
Patient did not experience any of the following events: a burn prior to discharge; a fall within the facility; wrong site/side/patient/procedure/implant event; or a hospital transfer or hospital admission upon discharge from the facility. (G8907) Patient did not have preoperative order for IV antibiotic SSI prophylaxis. (G8918)  

## 2013-01-27 NOTE — Patient Instructions (Addendum)
Discharge instructions given with verbal understanding. Handouts on polyps and diverticulosis given. Resume previous medications. YOU HAD AN ENDOSCOPIC PROCEDURE TODAY AT THE [facility]: Refer to the procedure report that was given to you for any specific questions about what was found during the examination.  If the procedure report does not answer your questions, please call your gastroenterologist to clarify.  If you requested that your care partner not be given the details of your procedure findings, then the procedure report has been included in a sealed envelope for you to review at your convenience later.  YOU SHOULD EXPECT: Some feelings of bloating in the abdomen. Passage of more gas than usual.  Walking can help get rid of the air that was put into your GI tract during the procedure and reduce the bloating. If you had a lower endoscopy (such as a colonoscopy or flexible sigmoidoscopy) you may notice spotting of blood in your stool or on the toilet paper. If you underwent a bowel prep for your procedure, then you may not have a normal bowel movement for a few days.  DIET: Your first meal following the procedure should be a light meal and then it is ok to progress to your normal diet.  A half-sandwich or bowl of soup is an example of a good first meal.  Heavy or fried foods are harder to digest and may make you feel nauseous or bloated.  Likewise meals heavy in dairy and vegetables can cause extra gas to form and this can also increase the bloating.  Drink plenty of fluids but you should avoid alcoholic beverages for 24 hours.  ACTIVITY: Your care partner should take you home directly after the procedure.  You should plan to take it easy, moving slowly for the rest of the day.  You can resume normal activity the day after the procedure however you should NOT DRIVE or use heavy machinery for 24 hours (because of the sedation medicines used during the test).    SYMPTOMS TO REPORT  IMMEDIATELY: A gastroenterologist can be reached at any hour.  During normal business hours, 8:30 AM to 5:00 PM Monday through Friday, call [phone].  After hours and on weekends, please call the GI answering service at [phone] who will take a message and have the physician on call contact you.   Following lower endoscopy (colonoscopy or flexible sigmoidoscopy):  Excessive amounts of blood in the stool  Significant tenderness or worsening of abdominal pains  Swelling of the abdomen that is new, acute  Fever of 100F or higher  Following upper endoscopy (EGD)  Vomiting of blood or coffee ground material  New chest pain or pain under the shoulder blades  Painful or persistently difficult swallowing  New shortness of breath  Fever of 100F or higher  Black, tarry-looking stools  FOLLOW UP: If any biopsies were taken you will be contacted by phone or by letter within the next 1-3 weeks.  Call your gastroenterologist if you have not heard about the biopsies in 3 weeks.  Our staff will call the home number listed on your records the next business day following your procedure to check on you and address any questions or concerns that you may have at that time regarding the information given to you following your procedure. This is a courtesy call and so if there is no answer at the home number and we have not heard from you through the emergency physician on call, we will assume that you   have returned to your regular daily activities without incident.  SIGNATURES/CONFIDENTIALITY: You and/or your care partner have signed paperwork which will be entered into your electronic medical record.  These signatures attest to the fact that that the information above on your After Visit Summary has been reviewed and is understood.  Full responsibility of the confidentiality of this discharge information lies with you and/or your care-partner.  

## 2013-01-28 ENCOUNTER — Telehealth: Payer: Self-pay | Admitting: *Deleted

## 2013-01-28 NOTE — Telephone Encounter (Signed)
  Follow up Call-  Call back number 01/27/2013  Post procedure Call Back phone  # [phone]  Permission to leave phone message Yes     Patient questions:  Do you have a fever, pain , or abdominal swelling? no Pain Score  0 *  Have you tolerated food without any problems? yes  Have you been able to return to your normal activities? yes  Do you have any questions about your discharge instructions: Diet   no Medications  no Follow up visit  no  Do you have questions or concerns about your Care? no  Actions: * If pain score is 4 or above: No action needed, pain <4.

## 2013-02-01 ENCOUNTER — Encounter: Payer: Self-pay | Admitting: Internal Medicine

## 2014-11-08 ENCOUNTER — Other Ambulatory Visit: Payer: Self-pay | Admitting: Internal Medicine

## 2014-11-08 DIAGNOSIS — R413 Other amnesia: Secondary | ICD-10-CM

## 2014-11-10 ENCOUNTER — Encounter (INDEPENDENT_AMBULATORY_CARE_PROVIDER_SITE_OTHER): Payer: Self-pay

## 2014-11-10 ENCOUNTER — Ambulatory Visit
Admission: RE | Admit: 2014-11-10 | Discharge: 2014-11-10 | Disposition: A | Discharge status: Home / Self Care | Payer: Medicare Other | Source: Ambulatory Visit | Attending: Internal Medicine | Admitting: Internal Medicine

## 2014-11-10 DIAGNOSIS — R413 Other amnesia: Secondary | ICD-10-CM

## 2018-02-06 ENCOUNTER — Encounter: Payer: Self-pay | Admitting: Internal Medicine

## 2018-02-23 ENCOUNTER — Encounter: Payer: Self-pay | Admitting: Internal Medicine

## 2018-04-27 ENCOUNTER — Ambulatory Visit (AMBULATORY_SURGERY_CENTER): Payer: Self-pay

## 2018-04-27 VITALS — Ht 70.0 in | Wt 209.0 lb

## 2018-04-27 DIAGNOSIS — Z86010 Personal history of colonic polyps: Secondary | ICD-10-CM

## 2018-04-27 MED ORDER — NA SULFATE-K SULFATE-MG SULF 17.5-3.13-1.6 GM/177ML PO SOLN: 1.0000 | Freq: Once | ORAL | 0 refills | Status: AC

## 2018-04-27 NOTE — Progress Notes (Signed)
Per pt, no allergies to soy or egg products.Pt not taking any weight loss meds or using  O2 at home.  Pt refused emmi video. 

## 2018-04-30 ENCOUNTER — Encounter: Payer: Self-pay | Admitting: Internal Medicine

## 2018-05-10 ENCOUNTER — Encounter: Payer: Self-pay | Admitting: Internal Medicine

## 2018-05-10 ENCOUNTER — Ambulatory Visit (AMBULATORY_SURGERY_CENTER): Payer: Medicare Other | Admitting: Internal Medicine

## 2018-05-10 ENCOUNTER — Other Ambulatory Visit: Payer: Self-pay

## 2018-05-10 VITALS — BP 106/75 | HR 77 | Temp 98.6°F | Resp 12 | Ht 70.0 in | Wt 209.0 lb

## 2018-05-10 DIAGNOSIS — Z86010 Personal history of colonic polyps: Secondary | ICD-10-CM

## 2018-05-10 DIAGNOSIS — D122 Benign neoplasm of ascending colon: Secondary | ICD-10-CM | POA: Diagnosis not present

## 2018-05-10 MED ORDER — SODIUM CHLORIDE 0.9 % IV SOLN: 500.0000 mL | Freq: Once | INTRAVENOUS | Status: AC

## 2018-05-10 NOTE — Progress Notes (Signed)
Pt's states no medical or surgical changes since previsit or office visit. 

## 2018-05-10 NOTE — Op Note (Signed)
[facility] Patient Name: [name] Procedure Date: 05/10/2018 8:17 AM MRN: [id number] Endoscopist: Docia Chuck. Henrene Pastor , MD Age: 77 Referring MD:  Date of Birth: November 15, 1941 Gender: Male Account #: [id number] Procedure:                Colonoscopy, with cold snare polypectomy x 1 Indications:              High risk colon cancer surveillance: Personal                            history of non-advanced adenoma, High risk colon                            cancer surveillance: Personal history of sessile                            serrated colon polyp (less than 10 mm in size) with                            no dysplasia. Index exam 2007 negative for                            neoplasia. Most recent exam 2014 Medicines:                Monitored Anesthesia Care Procedure:                Pre-Anesthesia Assessment:                           - Prior to the procedure, a History and Physical                            was performed, and patient medications and                            allergies were reviewed. The patient's tolerance of                            previous anesthesia was also reviewed. The risks                            and benefits of the procedure and the sedation                            options and risks were discussed with the patient.                            All questions were answered, and informed consent                            was obtained. Prior Anticoagulants: The patient has                            taken no previous anticoagulant or antiplatelet  agents. ASA Grade Assessment: II - A patient with                            mild systemic disease. After reviewing the risks                            and benefits, the patient was deemed in                            satisfactory condition to undergo the procedure.                           After obtaining informed consent, the colonoscope                            was  passed under direct vision. Throughout the                            procedure, the patient's blood pressure, pulse, and                            oxygen saturations were monitored continuously. The                            Colonoscope was introduced through the anus and                            advanced to the the cecum, identified by                            appendiceal orifice and ileocecal valve. The                            ileocecal valve, appendiceal orifice, and rectum                            were photographed. The quality of the bowel                            preparation was excellent. The colonoscopy was                            performed without difficulty. The patient tolerated                            the procedure well. The bowel preparation used was                            SUPREP. Scope In: 8:25:27 AM Scope Out: 8:39:30 AM Scope Withdrawal Time: 0 hours 10 minutes 25 seconds  Total Procedure Duration: 0 hours 14 minutes 3 seconds  Findings:                 A 4 mm polyp was found in the ascending colon. The  polyp was sessile. The polyp was removed with a                            cold snare. Resection and retrieval were complete.                           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                           Multiple small and large-mouthed diverticula were                            found in the entire colon.                           The exam was otherwise without abnormality on                            direct and retroflexion views. Complications:            No immediate complications. Estimated blood loss:                            None. Estimated Blood Loss:     Estimated blood loss: none. Impression:               - One 4 mm polyp in the ascending colon, removed                            with a cold snare. Resected and retrieved.                            - A single colonic angiodysplastic lesion.                           - Diverticulosis in the entire examined colon.                           - The examination was otherwise normal on direct                            and retroflexion views. Recommendation:           - Repeat colonoscopy is not recommended for                            surveillance.                           - Patient has a contact number available for                            emergencies. The signs and symptoms of potential  delayed complications were discussed with the                            patient. Return to normal activities tomorrow.                            Written discharge instructions were provided to the                            patient.                           - Resume previous diet.                           - Continue present medications.                           - Await pathology results. Docia Chuck. Henrene Pastor, MD 05/10/2018 8:46:05 AM This report has been signed electronically.

## 2018-05-10 NOTE — Progress Notes (Signed)
Report to PACU, RN, vss, BBS= Clear.  

## 2018-05-10 NOTE — Patient Instructions (Signed)
YOU HAD AN ENDOSCOPIC PROCEDURE TODAY AT [facility]:   Refer to the procedure report that was given to you for any specific questions about what was found during the examination.  If the procedure report does not answer your questions, please call your gastroenterologist to clarify.  If you requested that your care partner not be given the details of your procedure findings, then the procedure report has been included in a sealed envelope for you to review at your convenience later.  YOU SHOULD EXPECT: Some feelings of bloating in the abdomen. Passage of more gas than usual.  Walking can help get rid of the air that was put into your GI tract during the procedure and reduce the bloating. If you had a lower endoscopy (such as a colonoscopy or flexible sigmoidoscopy) you may notice spotting of blood in your stool or on the toilet paper. If you underwent a bowel prep for your procedure, you may not have a normal bowel movement for a few days.  Please Note:  You might notice some irritation and congestion in your nose or some drainage.  This is from the oxygen used during your procedure.  There is no need for concern and it should clear up in a day or so.  SYMPTOMS TO REPORT IMMEDIATELY:   Following lower endoscopy (colonoscopy or flexible sigmoidoscopy):  Excessive amounts of blood in the stool  Significant tenderness or worsening of abdominal pains  Swelling of the abdomen that is new, acute  Fever of 100F or higher  For urgent or emergent issues, a gastroenterologist can be reached at any hour by calling [phone].   DIET:  We do recommend a small meal at first, but then you may proceed to your regular diet.  Drink plenty of fluids but you should avoid alcoholic beverages for 24 hours.  ACTIVITY:  You should plan to take it easy for the rest of today and you should NOT DRIVE or use heavy machinery until tomorrow (because of the sedation medicines used during the test).     FOLLOW UP: Our staff will call the number listed on your records the next business day following your procedure to check on you and address any questions or concerns that you may have regarding the information given to you following your procedure. If we do not reach you, we will leave a message.  However, if you are feeling well and you are not experiencing any problems, there is no need to return our call.  We will assume that you have returned to your regular daily activities without incident.  If any biopsies were taken you will be contacted by phone or by letter within the next 1-3 weeks.  Please call us at [phone] if you have not heard about the biopsies in 3 weeks.   Await for biopsy results Polyps (handout given) Diverticulosis (handout given) No further Colonoscopies needed    SIGNATURES/CONFIDENTIALITY: You and/or your care partner have signed paperwork which will be entered into your electronic medical record.  These signatures attest to the fact that that the information above on your After Visit Summary has been reviewed and is understood.  Full responsibility of the confidentiality of this discharge information lies with you and/or your care-partner.

## 2018-05-10 NOTE — Progress Notes (Signed)
Called to room to assist during endoscopic procedure.  Patient ID and intended procedure confirmed with present staff. Received instructions for my participation in the procedure from the performing physician.  

## 2018-05-11 ENCOUNTER — Telehealth: Payer: Self-pay

## 2018-05-11 NOTE — Telephone Encounter (Signed)
  Follow up Call-  Call back number 05/10/2018  Post procedure Call Back phone  # [phone]  Permission to leave phone message Yes  Some recent data might be hidden     Patient questions:  Do you have a fever, pain , or abdominal swelling? No. Pain Score  0 *  Have you tolerated food without any problems? Yes.    Have you been able to return to your normal activities? Yes.    Do you have any questions about your discharge instructions: Diet   No. Medications  No. Follow up visit  No.  Do you have questions or concerns about your Care? No.  Actions: * If pain score is 4 or above: No action needed, pain <4.

## 2018-05-24 ENCOUNTER — Encounter: Payer: Self-pay | Admitting: Internal Medicine

## 2018-10-21 ENCOUNTER — Encounter: Payer: Self-pay | Admitting: Cardiovascular Disease

## 2018-10-21 ENCOUNTER — Ambulatory Visit: Payer: Medicare Other | Admitting: Cardiovascular Disease

## 2018-10-21 ENCOUNTER — Other Ambulatory Visit: Payer: Self-pay | Admitting: Cardiovascular Disease

## 2018-10-21 VITALS — BP 146/78 | HR 95 | Ht 70.0 in | Wt 206.8 lb

## 2018-10-21 DIAGNOSIS — I10 Essential (primary) hypertension: Secondary | ICD-10-CM

## 2018-10-21 DIAGNOSIS — Z8249 Family history of ischemic heart disease and other diseases of the circulatory system: Secondary | ICD-10-CM

## 2018-10-21 DIAGNOSIS — R0600 Dyspnea, unspecified: Secondary | ICD-10-CM

## 2018-10-21 DIAGNOSIS — Z13220 Encounter for screening for lipoid disorders: Secondary | ICD-10-CM | POA: Diagnosis not present

## 2018-10-21 DIAGNOSIS — K219 Gastro-esophageal reflux disease without esophagitis: Secondary | ICD-10-CM

## 2018-10-21 DIAGNOSIS — R0609 Other forms of dyspnea: Secondary | ICD-10-CM

## 2018-10-21 MED ORDER — METOPROLOL TARTRATE 50 MG PO TABS: ORAL_TABLET | ORAL | 0 refills | Status: DC

## 2018-10-21 NOTE — Progress Notes (Signed)
Cardiology Office Note    Date:  10/23/2018   ID:  [name], [name] August 22, 1941, MRN [id number]  PCP:  Eric Solian, MD  Cardiologist:  Eric Majestic, MD   No chief complaint on file.  New Cardiology evaluation, self-referred.  I care for [name].  History of Present Illness:  [name] is a 77 y.o. male who is self-referred for evaluation of exertional shortness of breath.  His primary physician is Dr. Dagmar Luna.  [name] has a history of hypertension and has been on olmesartan 20 mg daily.  He also has a history of GERD for which he takes omeprazole.  He states in the past he was on medication for hyperlipidemia and had taken simvastatin but he has not taken this in greater than 6 months.  He has a history of mild shortness of breath with activity.  More recently he has noticed perhaps slight increase with the shortness of breath particularly with uphill walking.  He hunts for his leisure and often notes shortness of breath with moving fast up hills.  He denies any definitive only associated chest heaviness.  He denies any PND, orthopnea, presyncope or syncope and denies any awareness of palpitations.  He has strong family history for heart disease that his mother had suffered a stroke and also had CABG revascularization surgery.  All 4 of his brothers have coronary artery disease of which 2 are deceased, one who had CABG revascularization surgery and another who had a MI.  His 2 living brothers have had coronary stents.  He has remote tobacco history and had smoked for approximately 15  years but quit approximately 40 years ago.  He presents for cardiology evaluation.   Past Medical History:  Diagnosis Date  . Allergy   . Appendicitis   . Cholecystitis   . Cholelithiasis   . Diverticulosis   . Gallstones   . GERD (gastroesophageal reflux disease)   . H pylori ulcer   . Hemorrhoids   . Hiatal hernia   . Hyperlipidemia   . Hypertension   . Kidney stones   .  Peptic ulcer    history of    Past Surgical History:  Procedure Laterality Date  . APPENDECTOMY    . CHOLECYSTECTOMY    . COLONOSCOPY    . INGUINAL HERNIA REPAIR     left  . KIDNEY STONE SURGERY     lithotripsy and removal of kidney stone  . UMBILICAL HERNIA REPAIR      Current Medications: Outpatient Medications Prior to Visit  Medication Sig Dispense Refill  . Ascorbic Acid (VITAMIN C PO) Take 500 mg by mouth daily.     Marland Kitchen aspirin EC 81 MG tablet Take 81 mg by mouth daily.    . calcium carbonate (TUMS - DOSED IN MG ELEMENTAL CALCIUM) 500 MG chewable tablet Chew 2 tablets by mouth at bedtime.    . Cholecalciferol 125 MCG (5000 UT) capsule Take 5,000 Units by mouth daily.    . Cyanocobalamin (VITAMIN B-12) 5000 MCG TBDP Take 5,000 mcg by mouth daily.    . Multiple Vitamins-Minerals (OCUVITE PO) Take by mouth daily at 6 (six) AM.    . olmesartan (BENICAR) 20 MG tablet Take 20 mg by mouth daily.    Marland Kitchen omeprazole (PRILOSEC OTC) 20 MG tablet Take 20 mg by mouth daily.    . Cholecalciferol (VITAMIN D3) 2000 UNITS capsule Take 2,000 Units by mouth daily.    . Coenzyme Q10 (COQ10) 100  MG CAPS Take 1 capsule by mouth daily.    . cyanocobalamin 1000 MCG tablet Take 2,500 mcg by mouth daily.     Marland Kitchen esomeprazole (NEXIUM) 40 MG capsule Take 40 mg by mouth daily before breakfast.    . fluticasone (FLONASE) 50 MCG/ACT nasal spray Place 2 sprays into the nose daily as needed. For allergies.    Marland Kitchen ibuprofen (ADVIL,MOTRIN) 200 MG tablet Take 200 mg by mouth as needed.    . loratadine (CLARITIN) 10 MG tablet Take 10 mg by mouth daily.     Facility-Administered Medications Prior to Visit  Medication Dose Route Frequency Provider Last Rate Last Dose  . 0.9 %  sodium chloride infusion  500 mL Intravenous Once Irene Shipper, MD         Allergies:   Penicillins   Social History   Socioeconomic History  . Marital status: Married    Spouse name: Not on file  . Number of children: 2  . Years of  education: Not on file  . Highest education level: Not on file  Occupational History  . Occupation: retired  Scientific laboratory technician  . Financial resource strain: Not on file  . Food insecurity:    Worry: Not on file    Inability: Not on file  . Transportation needs:    Medical: Not on file    Non-medical: Not on file  Tobacco Use  . Smoking status: Former Smoker    Types: Cigarettes    Last attempt to quit: 12/01/1978    Years since quitting: 39.9  . Smokeless tobacco: Former Systems developer    Quit date: 12/02/1975  Substance and Sexual Activity  . Alcohol use: Yes    Comment: ocassional wine  and beer  . Drug use: No  . Sexual activity: Not on file  Lifestyle  . Physical activity:    Days per week: Not on file    Minutes per session: Not on file  . Stress: Not on file  Relationships  . Social connections:    Talks on phone: Not on file    Gets together: Not on file    Attends religious service: Not on file    Active member of club or organization: Not on file    Attends meetings of clubs or organizations: Not on file    Relationship status: Not on file  Other Topics Concern  . Not on file  Social History Narrative  . Not on file     Additional social history is notable that he is married for 57 years.  He has 2 children, 5 grandchildren, and 4 great-grandchildren.  He completed ninth grade but received his GED degree.  He had worked for the [employer].  He is retired.  Family History:  The patient's family history includes CAD in his mother; CVA in his mother and unknown relative; Diabetes in his brother, brother, father, mother, other, and sister; Fibromyalgia in his daughter; Heart attack in his brother and brother; Heart disease in his brother, brother, brother, brother, and mother; Hypertension in his mother and son; Stomach cancer in his paternal grandfather; Stroke in his maternal grandmother and paternal grandmother.   ROS General: Negative; No fevers, chills, or night  sweats;  HEENT: Negative; No changes in vision or hearing, sinus congestion, difficulty swallowing Pulmonary: Negative; No cough, wheezing, hemoptysis Cardiovascular: See HPI GI: Negative; No nausea, vomiting, diarrhea, or abdominal pain GU: Negative; No dysuria, hematuria, or difficulty voiding Musculoskeletal: Negative; no myalgias, joint pain, or weakness  Hematologic/Oncology: Negative; no easy bruising, bleeding Endocrine: Negative; no heat/cold intolerance; no diabetes Neuro: Negative; no changes in balance, headaches Skin: Negative; No rashes or skin lesions Psychiatric: Negative; No behavioral problems, depression Sleep: Negative; No snoring, daytime sleepiness, hypersomnolence, bruxism, restless legs, hypnogognic hallucinations, no cataplexy Other comprehensive 14 point system review is negative.   PHYSICAL EXAM:   VS:  BP (!) 146/78   Pulse 95   Ht _0  (1.778 m)   Wt 206 lb 12.8 oz (93.8 kg)   BMI 29.67 kg/m    Wt Readings from Last 3 Encounters:  10/21/18 206 lb 12.8 oz (93.8 kg)  05/10/18 209 lb (94.8 kg)  04/27/18 209 lb (94.8 kg)    General: Alert, oriented, no distress.  Skin: normal turgor, no rashes, warm and dry HEENT: Normocephalic, atraumatic. Pupils equal round and reactive to light; sclera anicteric; extraocular muscles intact; Fundi no hemorrhages or exudates.  Discs flat Nose without nasal septal hypertrophy Mouth/Parynx benign; Mallinpatti scale 2 Neck: No JVD, no carotid bruits; normal carotid upstroke Lungs: clear to ausculatation and percussion; no wheezing or rales Chest wall: without tenderness to palpitation Heart: PMI not displaced, RRR, s1 s2 normal, 1/6 systolic murmur, no diastolic murmur, no rubs, gallops, thrills, or heaves Abdomen: soft, nontender; no hepatosplenomehaly, BS+; abdominal aorta nontender and not dilated by palpation. Back: no CVA tenderness Pulses 2+ Musculoskeletal: full range of motion, normal strength, no joint  deformities Extremities: no clubbing cyanosis or edema, Homan's sign negative  Neurologic: grossly nonfocal; Cranial nerves grossly wnl Psychologic: Normal mood and affect   Studies/Labs Reviewed:   EKG:  EKG is ordered today.  ECG (independently read by me): Sinus rhythm at 95 bpm.  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  No ectopy.  Recent Labs: BMP Latest Ref Rng & Units 09/18/2010 12/13/2009 08/06/2006  Glucose 70 - 99 mg/dL 85 132(H) 127(H)  BUN 6 - 23 mg/dL _1 Creatinine 0.4 - 1.5 mg/dL 0.99 0.81 0.87  Sodium 135 - 145 mEq/L 140 139 139  Potassium 3.5 - 5.1 mEq/L 4.4 4.2 4.3  Chloride 96 - 112 mEq/L 108 104 106  CO2 19 - 32 mEq/L _2 Calcium 8.4 - 10.5 mg/dL 9.1 9.2 9.4     Hepatic Function Latest Ref Rng & Units 08/06/2006  Total Protein 6.0 - 8.3 g/dL 6.9  Albumin 3.5 - 5.2 g/dL 4.2  AST 0 - 37 U/L 23  ALT 0 - 40 U/L 26  Alk Phosphatase 39 - 117 U/L 86  Total Bilirubin 0.3 - 1.2 mg/dL 0.8    CBC Latest Ref Rng & Units 09/18/2010 12/14/2009 09/11/2006  WBC 4.0 - 10.5 K/uL 7.6 - 7.1  Hemoglobin 13.0 - 17.0 g/dL 15.2 15.8 14.8  Hematocrit 39.0 - 52.0 % 44.1 - 42.8  Platelets 150 - 400 K/uL 230 - 302   Lab Results  Component Value Date   MCV 88.0 09/18/2010   MCV 89.2 09/11/2006   MCV 89.5 08/06/2006   No results found for: TSH No results found for: HGBA1C   BNP No results found for: BNP  ProBNP No results found for: PROBNP   Lipid Panel  No results found for: CHOL, TRIG, HDL, CHOLHDL, VLDL, LDLCALC, LDLDIRECT   RADIOLOGY: No results found.   Additional studies/ records that were reviewed today include:   I reviewed laboratory from [facility] from June 30, 2018.  Total cholesterol 164, HDL 46, LDL 104, triglycerides 69. Hemoglobin 14.5.  Creatinine 0.9.  ALT 18.  TSH 1.1   ASSESSMENT:    1. DOE (dyspnea on exertion)   2. Essential hypertension   3. Family history of coronary artery disease   4. Lipid screening    5. Gastroesophageal reflux disease without esophagitis      PLAN:  [name] is a 77 year old gentleman who has a several year history of hypertension for which most recently he is on olmesartan.  He also has a history of mild hyperlipidemia and remotely had been on simvastatin.  He has a history of GERD for which he takes omeprazole.  He presented to the office today with a chief complaint of exertional dyspnea which seems to be most prominent particularly when he walks uphill and improves with rest.  He has noticed this when he hunts and fishes.  He denies any history of chest tightness or pressure.  He has a very strong family history for heart disease with all his brothers having either MI, CABG revascularization, or coronary stents.  I am recommending that he undergo a 2D echo Doppler study to evaluate for systolic and diastolic function in addition to valvular architecture.  With his significant CAD family history, I am also recommended he undergo a coronary CTA for further evaluation of his exertional dyspnea to make certain this is not potentially ischemic mediated.  I am recommending fasting laboratory be obtained and adjustments to his medical regimen will be made if necessary.  I will see him back in the office in follow-up of the above studies for reevaluation.   Medication Adjustments/Labs and Tests Ordered: Current medicines are reviewed at length with the patient today.  Concerns regarding medicines are outlined above.  Medication changes, Labs and Tests ordered today are listed in the Patient Instructions below. Patient Instructions  Medication Instructions:  Your physician recommends that you continue on your current medications as directed. Please refer to the Current Medication list given to you today.  If you need a refill on your cardiac medications before your next appointment, please call your pharmacy.   Lab work: Please return for FASTING labs (CMET, CBC, Lipid,  TSH)  Our in office lab hours are Monday-Friday 8:00-4:00, closed for lunch 12:45-1:45 pm.  No appointment needed.  If you have labs (blood work) drawn today and your tests are completely normal, you will receive your results only by: Marland Kitchen MyChart Message (if you have MyChart) OR . A paper copy in the mail If you have any lab test that is abnormal or we need to change your treatment, we will call you to review the results.  Testing/Procedures: Your physician has requested that you have an echocardiogram. Echocardiography is a painless test that uses sound waves to create images of your heart. It provides your doctor with information about the size and shape of your heart and how well your heart's chambers and valves are working. This procedure takes approximately one hour. There are no restrictions for this procedure. This will be done at our [facility] location:  [facility] has requested that you have cardiac CT. Cardiac computed tomography (CT) is a painless test that uses an x-ray machine to take clear, detailed pictures of your heart. For further information please visit [email or web address]. Please follow instruction sheet as given.  Follow-Up: At [facility], you and your health needs are our priority.  As part of our continuing mission to provide you with exceptional heart care, we have created designated Provider Care Teams.  These Care Teams include your primary Cardiologist (physician) and Advanced Practice Providers (APPs -  Physician Assistants and Nurse Practitioners) who all work together to provide you with the care you need, when you need it. You will need a follow up appointment in 2 months.  Please call our office 2 months in advance to schedule this appointment.  You may see Dr. Claiborne Billings or one of the following Advanced Practice Providers on your designated Care Team: [place], [place] . Fabian Sharp, PA-C       Signed, Eric Majestic, MD    10/23/2018 11:59 AM    [facility] [address], [place], [place], [place]  [address] Phone: [phone]

## 2018-10-21 NOTE — Patient Instructions (Signed)
Medication Instructions:  Your physician recommends that you continue on your current medications as directed. Please refer to the Current Medication list given to you today.  If you need a refill on your cardiac medications before your next appointment, please call your pharmacy.   Lab work: Please return for FASTING labs (CMET, CBC, Lipid, TSH)  Our in office lab hours are Monday-Friday 8:00-4:00, closed for lunch 12:45-1:45 pm.  No appointment needed.  If you have labs (blood work) drawn today and your tests are completely normal, you will receive your results only by: Marland Kitchen MyChart Message (if you have MyChart) OR . A paper copy in the mail If you have any lab test that is abnormal or we need to change your treatment, we will call you to review the results.  Testing/Procedures: Your physician has requested that you have an echocardiogram. Echocardiography is a painless test that uses sound waves to create images of your heart. It provides your doctor with information about the size and shape of your heart and how well your heart's chambers and valves are working. This procedure takes approximately one hour. There are no restrictions for this procedure. This will be done at our [facility] location:  [place] has requested that you have cardiac CT. Cardiac computed tomography (CT) is a painless test that uses an x-ray machine to take clear, detailed pictures of your heart. For further information please visit [email or web address]. Please follow instruction sheet as given.  Follow-Up: At [facility], you and your health needs are our priority.  As part of our continuing mission to provide you with exceptional heart care, we have created designated Provider Care Teams.  These Care Teams include your primary Cardiologist (physician) and Advanced Practice Providers (APPs -  Physician Assistants and Nurse Practitioners) who all work together to provide you  with the care you need, when you need it. You will need a follow up appointment in 2 months.  Please call our office 2 months in advance to schedule this appointment.  You may see Dr. Claiborne Billings or one of the following Advanced Practice Providers on your designated Care Team: [place], [place] . Fabian Sharp, PA-C

## 2018-10-23 ENCOUNTER — Encounter: Payer: Self-pay | Admitting: Cardiovascular Disease

## 2018-10-26 ENCOUNTER — Ambulatory Visit (HOSPITAL_COMMUNITY): Payer: Medicare Other | Attending: Cardiovascular Disease

## 2018-10-26 ENCOUNTER — Other Ambulatory Visit: Payer: Self-pay

## 2018-10-26 DIAGNOSIS — R0609 Other forms of dyspnea: Secondary | ICD-10-CM

## 2018-10-26 DIAGNOSIS — R0600 Dyspnea, unspecified: Secondary | ICD-10-CM

## 2018-11-16 LAB — COMPREHENSIVE METABOLIC PANEL
A/G RATIO: 1.8 (ref 1.2–2.2)
ALK PHOS: 90 IU/L (ref 39–117)
ALT: 20 IU/L (ref 0–44)
AST: 27 IU/L (ref 0–40)
Albumin: 4.5 g/dL (ref 3.5–4.8)
BILIRUBIN TOTAL: 0.8 mg/dL (ref 0.0–1.2)
BUN/Creatinine Ratio: 13 (ref 10–24)
BUN: 12 mg/dL (ref 8–27)
CHLORIDE: 101 mmol/L (ref 96–106)
CO2: 24 mmol/L (ref 20–29)
Calcium: 9.6 mg/dL (ref 8.6–10.2)
Creatinine, Ser: 0.89 mg/dL (ref 0.76–1.27)
GFR calc Af Amer: 95 mL/min/{1.73_m2} (ref >59)
GFR calc non Af Amer: 82 mL/min/{1.73_m2} (ref >59)
GLUCOSE: 84 mg/dL (ref 65–99)
Globulin, Total: 2.5 g/dL (ref 1.5–4.5)
POTASSIUM: 5 mmol/L (ref 3.5–5.2)
Sodium: 140 mmol/L (ref 134–144)
TOTAL PROTEIN: 7 g/dL (ref 6.0–8.5)

## 2018-11-16 LAB — LIPID PANEL
Chol/HDL Ratio: 4.1 ratio (ref 0.0–5.0)
Cholesterol, Total: 191 mg/dL (ref 100–199)
HDL: 47 mg/dL (ref >39)
LDL Calculated: 124 mg/dL — ABNORMAL HIGH (ref 0–99)
TRIGLYCERIDES: 100 mg/dL (ref 0–149)
VLDL Cholesterol Cal: 20 mg/dL (ref 5–40)

## 2018-11-16 LAB — TSH: TSH: 2.74 u[IU]/mL (ref 0.450–4.500)

## 2018-11-16 LAB — CBC
HEMOGLOBIN: 14.8 g/dL (ref 13.0–17.7)
Hematocrit: 42.7 % (ref 37.5–51.0)
MCH: 30.6 pg (ref 26.6–33.0)
MCHC: 34.7 g/dL (ref 31.5–35.7)
MCV: 88 fL (ref 79–97)
Platelets: 148 10*3/uL — ABNORMAL LOW (ref 150–450)
RBC: 4.83 x10E6/uL (ref 4.14–5.80)
RDW: 12.6 % (ref 12.3–15.4)
WBC: 8.7 10*3/uL (ref 3.4–10.8)

## 2018-11-19 ENCOUNTER — Encounter (HOSPITAL_COMMUNITY): Payer: Self-pay | Admitting: Emergency Medicine

## 2018-11-22 ENCOUNTER — Telehealth (HOSPITAL_COMMUNITY): Payer: Self-pay | Admitting: Emergency Medicine

## 2018-11-22 NOTE — Telephone Encounter (Signed)
Reaching out to patient to offer assistance regarding upcoming cardiac imaging study; pt verbalizes understanding of appt date/time, parking situation and where to check in, pre-test NPO status and medications ordered, and verified current allergies; name and call back number provided for further questions should they arise Mardie Kellen RN Navigator Cardiac Imaging [phone] 

## 2018-11-23 ENCOUNTER — Ambulatory Visit (HOSPITAL_COMMUNITY): Admission: RE | Admit: 2018-11-23 | Payer: Medicare Other | Source: Ambulatory Visit

## 2018-11-23 ENCOUNTER — Ambulatory Visit (HOSPITAL_COMMUNITY)
Admission: RE | Admit: 2018-11-23 | Discharge: 2018-11-23 | Disposition: A | Discharge status: Home / Self Care | Payer: Medicare Other | Source: Ambulatory Visit | Attending: Cardiovascular Disease | Admitting: Cardiovascular Disease

## 2018-11-23 DIAGNOSIS — R0609 Other forms of dyspnea: Secondary | ICD-10-CM | POA: Diagnosis present

## 2018-11-23 DIAGNOSIS — K449 Diaphragmatic hernia without obstruction or gangrene: Secondary | ICD-10-CM | POA: Insufficient documentation

## 2018-11-23 DIAGNOSIS — Z8249 Family history of ischemic heart disease and other diseases of the circulatory system: Secondary | ICD-10-CM | POA: Diagnosis present

## 2018-11-23 DIAGNOSIS — I2510 Atherosclerotic heart disease of native coronary artery without angina pectoris: Secondary | ICD-10-CM | POA: Insufficient documentation

## 2018-11-23 DIAGNOSIS — R0600 Dyspnea, unspecified: Secondary | ICD-10-CM

## 2018-11-23 MED ORDER — METOPROLOL TARTRATE 5 MG/5ML IV SOLN: 5.0000 mg | INTRAVENOUS | Status: DC | PRN

## 2018-11-23 MED ORDER — NITROGLYCERIN 0.4 MG SL SUBL
SUBLINGUAL_TABLET | SUBLINGUAL | Status: AC
  Filled 2018-11-23: qty 2

## 2018-11-23 MED ORDER — IOPAMIDOL (ISOVUE-370) INJECTION 76%
80.0000 mL | Freq: Once | INTRAVENOUS | Status: AC | PRN
  Administered 2018-11-23: 80 mL via INTRAVENOUS

## 2018-11-23 MED ORDER — NITROGLYCERIN 0.4 MG SL SUBL
0.8000 mg | SUBLINGUAL_TABLET | SUBLINGUAL | Status: DC | PRN
  Filled 2018-11-23: qty 25

## 2018-12-28 ENCOUNTER — Encounter: Payer: Self-pay | Admitting: Cardiovascular Disease

## 2018-12-28 ENCOUNTER — Ambulatory Visit: Payer: Medicare Other | Admitting: Cardiovascular Disease

## 2018-12-28 VITALS — BP 128/72 | HR 102 | Ht 70.0 in | Wt 210.0 lb

## 2018-12-28 DIAGNOSIS — R0609 Other forms of dyspnea: Secondary | ICD-10-CM | POA: Diagnosis not present

## 2018-12-28 DIAGNOSIS — I452 Bifascicular block: Secondary | ICD-10-CM

## 2018-12-28 DIAGNOSIS — E785 Hyperlipidemia, unspecified: Secondary | ICD-10-CM | POA: Diagnosis not present

## 2018-12-28 DIAGNOSIS — I2510 Atherosclerotic heart disease of native coronary artery without angina pectoris: Secondary | ICD-10-CM | POA: Diagnosis not present

## 2018-12-28 DIAGNOSIS — E669 Obesity, unspecified: Secondary | ICD-10-CM

## 2018-12-28 DIAGNOSIS — I10 Essential (primary) hypertension: Secondary | ICD-10-CM

## 2018-12-28 DIAGNOSIS — I2584 Coronary atherosclerosis due to calcified coronary lesion: Secondary | ICD-10-CM

## 2018-12-28 DIAGNOSIS — R0600 Dyspnea, unspecified: Secondary | ICD-10-CM

## 2018-12-28 DIAGNOSIS — K219 Gastro-esophageal reflux disease without esophagitis: Secondary | ICD-10-CM

## 2018-12-28 MED ORDER — ROSUVASTATIN CALCIUM 20 MG PO TABS: 20.0000 mg | ORAL_TABLET | Freq: Every day | ORAL | 3 refills | Status: DC

## 2018-12-28 MED ORDER — METOPROLOL SUCCINATE ER 25 MG PO TB24: 25.0000 mg | ORAL_TABLET | Freq: Every day | ORAL | 2 refills | Status: DC

## 2018-12-28 NOTE — Progress Notes (Signed)
Cardiology Office Note    Date:  12/28/2018   ID:  Eric, Luna [birth year], MRN [id number]  PCP:  Eric Solian, MD  Cardiologist:  Eric Majestic, MD   No chief complaint on file.  F/U Cardiology evaluation, self-referred.  I care for [name].  History of Present Illness:  [name] is a 78 y.o. male who was self-referred for evaluation of exertional shortness of breath.  I saw him for initial evaluation in October 21, 2018.  He presents for follow-up evaluation.  [name] has a history of hypertension and has been on olmesartan 20 mg daily.  He also has a history of GERD for which he takes omeprazole.  He states in the past he was on medication for hyperlipidemia and had taken simvastatin but he has not taken this in greater than 6 months.  He has a history of mild shortness of breath with activity.  More recently he has noticed perhaps slight increase with the shortness of breath particularly with uphill walking.  He hunts for his leisure and often notes shortness of breath with moving fast up hills.  He denies any definitive only associated chest heaviness.  He denies any PND, orthopnea, presyncope or syncope and denies any awareness of palpitations.  He has strong family history for heart disease that his mother had suffered a stroke and also had CABG revascularization surgery.  All 4 of his brothers have coronary artery disease of which 2 are deceased, one who had CABG revascularization surgery and another who had a MI.  His 2 living brothers have had coronary stents.  He has remote tobacco history and had smoked for approximately 15  years but quit approximately 40 years ago.   When I initially saw him, I recommended that he undergo a 2D echo Doppler study to evaluate systolic and diastolic function in addition to his valvular architecture.  With his significant CAD family history of also recommended a coronary CT angiogram for further evaluation of exertional  dyspnea to make certain it was not ischemia mediated.  His echo Doppler study was done on October 26, 2018 and showed an EF of 60 to 65%.  He had grade 1 diastolic dysfunction.  There was mild aortic regurgitation.  PA pressure was mildly increased at 33 mm.  Coronary CTA yielded a calcium score of 12 which is the 8th percentile for age and sex.  He was noted to have mild coronary plaque that was nonobstructive with less than 50% mixed plaque in the distal left main, less than 30% mixed plaque in the proximal LAD, less than 30% soft plaque in the proximal circumflex and less than 30% mixed plaque in the mid RCA.  Lipid studies were notable for an LDL cholesterol of 124.  He has not been on any lipid-lowering therapy.  His resting pulse typically is elevated.  He still notes mild shortness of breath with walking.  He presents for evaluation.   Past Medical History:  Diagnosis Date  . Allergy   . Appendicitis   . Cholecystitis   . Cholelithiasis   . Diverticulosis   . Gallstones   . GERD (gastroesophageal reflux disease)   . H pylori ulcer   . Hemorrhoids   . Hiatal hernia   . Hyperlipidemia   . Hypertension   . Kidney stones   . Peptic ulcer    history of    Past Surgical History:  Procedure Laterality Date  . APPENDECTOMY    .  CHOLECYSTECTOMY    . COLONOSCOPY    . INGUINAL HERNIA REPAIR     left  . KIDNEY STONE SURGERY     lithotripsy and removal of kidney stone  . UMBILICAL HERNIA REPAIR      Current Medications: Outpatient Medications Prior to Visit  Medication Sig Dispense Refill  . Ascorbic Acid (VITAMIN C PO) Take 500 mg by mouth daily.     Marland Kitchen aspirin EC 81 MG tablet Take 81 mg by mouth daily.    . calcium carbonate (TUMS - DOSED IN MG ELEMENTAL CALCIUM) 500 MG chewable tablet Chew 2 tablets by mouth at bedtime.    . Cholecalciferol 125 MCG (5000 UT) capsule Take 5,000 Units by mouth daily.    . Cyanocobalamin (VITAMIN B-12) 5000 MCG TBDP Take 5,000 mcg by mouth daily.     . metoprolol tartrate (LOPRESSOR) 50 MG tablet TAKE 1 TABLET BY MOUTH TWO HOURS PRIOR TO CT 90 tablet 0  . Multiple Vitamins-Minerals (OCUVITE PO) Take by mouth daily at 6 (six) AM.    . olmesartan (BENICAR) 20 MG tablet Take 20 mg by mouth daily.    Marland Kitchen omeprazole (PRILOSEC OTC) 20 MG tablet Take 20 mg by mouth daily.     Facility-Administered Medications Prior to Visit  Medication Dose Route Frequency Provider Last Rate Last Dose  . 0.9 %  sodium chloride infusion  500 mL Intravenous Once Irene Shipper, MD         Allergies:   Penicillins   Social History   Socioeconomic History  . Marital status: Married    Spouse name: Not on file  . Number of children: 2  . Years of education: Not on file  . Highest education level: Not on file  Occupational History  . Occupation: retired  Scientific laboratory technician  . Financial resource strain: Not on file  . Food insecurity:    Worry: Not on file    Inability: Not on file  . Transportation needs:    Medical: Not on file    Non-medical: Not on file  Tobacco Use  . Smoking status: Former Smoker    Types: Cigarettes    Last attempt to quit: 12/01/1978    Years since quitting: 40.1  . Smokeless tobacco: Former Systems developer    Quit date: 12/02/1975  Substance and Sexual Activity  . Alcohol use: Yes    Comment: ocassional wine  and beer  . Drug use: No  . Sexual activity: Not on file  Lifestyle  . Physical activity:    Days per week: Not on file    Minutes per session: Not on file  . Stress: Not on file  Relationships  . Social connections:    Talks on phone: Not on file    Gets together: Not on file    Attends religious service: Not on file    Active member of club or organization: Not on file    Attends meetings of clubs or organizations: Not on file    Relationship status: Not on file  Other Topics Concern  . Not on file  Social History Narrative  . Not on file     Additional social history is notable that he is married for 57 years.  He has 2  children, 5 grandchildren, and 4 great-grandchildren.  He completed ninth grade but received his GED degree.  He had worked for the [employer].  He is retired.  Family History:  The patient's family history includes CAD in his mother;  CVA in his mother and another family member; Diabetes in his brother, brother, father, mother, sister, and another family member; Fibromyalgia in his daughter; Heart attack in his brother and brother; Heart disease in his brother, brother, brother, brother, and mother; Hypertension in his mother and son; Stomach cancer in his paternal grandfather; Stroke in his maternal grandmother and paternal grandmother.   ROS General: Negative; No fevers, chills, or night sweats;  HEENT: Negative; No changes in vision or hearing, sinus congestion, difficulty swallowing Pulmonary: Negative; No cough, wheezing, hemoptysis Cardiovascular: See HPI GI: Negative; No nausea, vomiting, diarrhea, or abdominal pain GU: Negative; No dysuria, hematuria, or difficulty voiding Musculoskeletal: Negative; no myalgias, joint pain, or weakness Hematologic/Oncology: Negative; no easy bruising, bleeding Endocrine: Negative; no heat/cold intolerance; no diabetes Neuro: Negative; no changes in balance, headaches Skin: Negative; No rashes or skin lesions Psychiatric: Negative; No behavioral problems, depression Sleep: Negative; No snoring, daytime sleepiness, hypersomnolence, bruxism, restless legs, hypnogognic hallucinations, no cataplexy Other comprehensive 14 point system review is negative.   PHYSICAL EXAM:   VS:  BP 128/72   Pulse (!) 102   Ht '5\' 10"'  (1.778 m)   Wt 210 lb (95.3 kg)   SpO2 99%   BMI 30.13 kg/m     Repeat blood pressure by me was 128/70 Wt Readings from Last 3 Encounters:  12/28/18 210 lb (95.3 kg)  10/21/18 206 lb 12.8 oz (93.8 kg)  05/10/18 209 lb (94.8 kg)    General: Alert, oriented, no distress.  Skin: normal turgor, no rashes, warm and dry HEENT:  Normocephalic, atraumatic. Pupils equal round and reactive to light; sclera anicteric; extraocular muscles intact;  Nose without nasal septal hypertrophy Mouth/Parynx benign; Mallinpatti scale 3 Neck: No JVD, no carotid bruits; normal carotid upstroke Lungs: clear to ausculatation and percussion; no wheezing or rales Chest wall: without tenderness to palpitation Heart: PMI not displaced, RRR, s1 s2 normal, 1/6 systolic murmur, no diastolic murmur, no rubs, gallops, thrills, or heaves Abdomen: soft, nontender; no hepatosplenomehaly, BS+; abdominal aorta nontender and not dilated by palpation. Back: no CVA tenderness Pulses 2+ Musculoskeletal: full range of motion, normal strength, no joint deformities Extremities: no clubbing cyanosis or edema, Homan's sign negative  Neurologic: grossly nonfocal; Cranial nerves grossly wnl Psychologic: Normal mood and affect   Studies/Labs Reviewed:   EKG:  EKG is ordered today.  ECG (independently read by me): Normal sinus rhythm at 99 bpm.  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  PR interval 176 ms; QTc interval 492 ms  October 21, 2018 ECG (independently read by me): Sinus rhythm at 95 bpm.  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  No ectopy.  Recent Labs: BMP Latest Ref Rng & Units 11/16/2018 09/18/2010 12/13/2009  Glucose 65 - 99 mg/dL 84 85 132(H)  BUN 8 - 27 mg/dL '12 12 11  ' Creatinine 0.76 - 1.27 mg/dL 0.89 0.99 0.81  BUN/Creat Ratio 10 - 24 13 - -  Sodium 134 - 144 mmol/L 140 140 139  Potassium 3.5 - 5.2 mmol/L 5.0 4.4 4.2  Chloride 96 - 106 mmol/L 101 108 104  CO2 20 - 29 mmol/L '24 26 29  ' Calcium 8.6 - 10.2 mg/dL 9.6 9.1 9.2     Hepatic Function Latest Ref Rng & Units 11/16/2018 08/06/2006  Total Protein 6.0 - 8.5 g/dL 7.0 6.9  Albumin 3.5 - 4.8 g/dL 4.5 4.2  AST 0 - 40 IU/L 27 23  ALT 0 - 44 IU/L 20 26  Alk  Phosphatase 39 - 117 IU/L 90 86  Total Bilirubin 0.0 - 1.2 mg/dL 0.8 0.8      CBC Latest Ref Rng & Units 11/16/2018 09/18/2010 12/14/2009  WBC 3.4 - 10.8 x10E3/uL 8.7 7.6 -  Hemoglobin 13.0 - 17.7 g/dL 14.8 15.2 15.8  Hematocrit 37.5 - 51.0 % 42.7 44.1 -  Platelets 150 - 450 x10E3/uL 148(L) 230 -   Lab Results  Component Value Date   MCV 88 11/16/2018   MCV 88.0 09/18/2010   MCV 89.2 09/11/2006   Lab Results  Component Value Date   TSH 2.740 11/16/2018   No results found for: HGBA1C   BNP No results found for: BNP  ProBNP No results found for: PROBNP   Lipid Panel     Component Value Date/Time   CHOL 191 11/16/2018 0914   TRIG 100 11/16/2018 0914   HDL 47 11/16/2018 0914   CHOLHDL 4.1 11/16/2018 0914   LDLCALC 124 (H) 11/16/2018 0914     RADIOLOGY: No results found.   Additional studies/ records that were reviewed today include:   I reviewed laboratory from [facility] from June 30, 2018.  Total cholesterol 164, HDL 46, LDL 104, triglycerides 69. Hemoglobin 14.5.  Creatinine 0.9.  ALT 18.  TSH 1.1   ASSESSMENT:    1. Essential hypertension   2. DOE (dyspnea on exertion)   3. Coronary artery calcification   4. Hyperlipidemia with target LDL less than 70   5. Bundle branch block, right and left anterior fascicular   6. Gastroesophageal reflux disease without esophagitis   7. Mild obesity     PLAN:  [name] is a 78 year old gentleman who has a several year history of hypertension for which most recently he is on olmesartan.  He also has a history of mild hyperlipidemia and remotely had been on simvastatin but had not been on treatment for some time.  He has a history of GERD for which he takes omeprazole.  Initially saw him he admitted to symptoms of exertional dyspnea which typically was precipitated with walking uphill but improved with rest.  I thoroughly reviewed his imaging studies and laboratory with him in detail.  He has normal systolic function with an EF of 60 to 65% and grade 1 diastolic dysfunction on  echocardiography.  PA pressure was mildly increased at 33 mm.  There was mild AR and mild left atrial dilatation.  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  His most recent lipid studies have shown an LDL of 124.  I discussed with him with the presence of coronary plaque more aggressive lipid intervention is essential in an attempt to induce potential plaque regression.  We discussed a 50% reduction in his LDL and if so this would result in an LDL cholesterol of 62.  For this reason I am initiating rosuvastatin 20 mg.  His resting pulse today is 99.  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  Recommending the addition of metoprolol succinate and will start this at 25 mg which will be helpful to lower his resting heart rate and attenuate his submaximal heart rate with activity.  He will monitor his blood pressure.  He denies any edema or episodes of chest tightness.  He has a history of GERD and continues to take omeprazole.  He has mildly obese with a BMI of 30.13.  Weight loss was recommended.  In 3 months  he will undergo repeat laboratory consisting of a chemistry profile lipid studies and I will see him in the office for follow-up evaluation.  Medication Adjustments/Labs and Tests Ordered: Current medicines are reviewed at length with the patient today.  Concerns regarding medicines are outlined above.  Medication changes, Labs and Tests ordered today are listed in the Patient Instructions below. Patient Instructions  Medication Instructions:  Start Metoprolol 25 mg Start Rosuvastatin 20 mg  If you need a refill on your cardiac medications before your next appointment, please call your pharmacy.   Lab work: Fasting labs in 3 months before follow up appointment (CMET, LIPID) If you have labs (blood work) drawn today and your tests are completely normal, you will receive your results  only by: Marland Kitchen MyChart Message (if you have MyChart) OR . A paper copy in the mail If you have any lab test that is abnormal or we need to change your treatment, we will call you to review the results.   Follow-Up: At [facility], you and your health needs are our priority.  As part of our continuing mission to provide you with exceptional heart care, we have created designated Provider Care Teams.  These Care Teams include your primary Cardiologist (physician) and Advanced Practice Providers (APPs -  Physician Assistants and Nurse Practitioners) who all work together to provide you with the care you need, when you need it. You will need a follow up appointment in 3 months. You may see Dr.Kelly or one of the following Advanced Practice Providers on your designated Care Team: Almyra Deforest, [place] . Fabian Sharp, PA-C        Signed, Eric Majestic, MD  12/28/2018 12:47 PM    [facility] [address], [place], [place], [place]  [address] Phone: [phone]

## 2018-12-28 NOTE — Patient Instructions (Signed)
Medication Instructions:  Start Metoprolol 25 mg Start Rosuvastatin 20 mg  If you need a refill on your cardiac medications before your next appointment, please call your pharmacy.   Lab work: Fasting labs in 3 months before follow up appointment (CMET, LIPID) If you have labs (blood work) drawn today and your tests are completely normal, you will receive your results only by: Marland Kitchen MyChart Message (if you have MyChart) OR . A paper copy in the mail If you have any lab test that is abnormal or we need to change your treatment, we will call you to review the results.   Follow-Up: At [facility], you and your health needs are our priority.  As part of our continuing mission to provide you with exceptional heart care, we have created designated Provider Care Teams.  These Care Teams include your primary Cardiologist (physician) and Advanced Practice Providers (APPs -  Physician Assistants and Nurse Practitioners) who all work together to provide you with the care you need, when you need it. You will need a follow up appointment in 3 months. You may see Dr.Kelly or one of the following Advanced Practice Providers on your designated Care Team: Almyra Deforest, [place] . Fabian Sharp, PA-C

## 2018-12-31 NOTE — Addendum Note (Signed)
Addended by: Fidel Levy on: 12/31/2018 10:51 AM   Modules accepted: Orders

## 2019-01-07 ENCOUNTER — Telehealth: Payer: Self-pay | Admitting: Cardiovascular Disease

## 2019-01-07 NOTE — Telephone Encounter (Signed)
Records received from [facility] on 01/07/19, Appt 03/30/19 @ 8:00AM.[place]

## 2019-03-25 ENCOUNTER — Telehealth: Payer: Self-pay | Admitting: Cardiovascular Disease

## 2019-03-25 NOTE — Telephone Encounter (Signed)
New Message:    Pt is on Dr Evette Georges schedule for 03-30-19. I was calling to reschedule if pt was not having any problems. He says he have been having what feels like Acid Indigestion. He said he received  some relief when he took Metoprolol and Pepcid. Please call to advise.

## 2019-03-25 NOTE — Telephone Encounter (Signed)
Spoke with pt, he has been having acid indigestion type discomfort for several days. It usually bothers him in the early morning or evening and usually occurs around eating. He was taking tums with little relief and is now taking pepcid and it helps with the indigestion. He denies exertional symptoms or SOB. He will keep his follow up Wednesday next week. Reassurance given to the patient it sounds more stomach related than heart.

## 2019-03-29 ENCOUNTER — Telehealth: Payer: Self-pay | Admitting: Cardiovascular Disease

## 2019-03-29 NOTE — Telephone Encounter (Signed)
Call [phone] my chart via email/ consent/ pre reg completed

## 2019-03-29 NOTE — Telephone Encounter (Signed)
Virtual Visit Pre-Appointment Phone Call  "(Name), I am calling you today to discuss your upcoming appointment. We are currently trying to limit exposure to the virus that causes COVID-19 by seeing patients at home rather than in the office."  1. "What is the BEST phone number to call the day of the visit?" - include this in appointment notes  2. Do you have or have access to (through a family member/friend) a smartphone with video capability that we can use for your visit?" a. If yes - list this number in appt notes as cell (if different from BEST phone #) and list the appointment type as a VIDEO visit in appointment notes b. If no - list the appointment type as a PHONE visit in appointment notes  3. Confirm consent - "In the setting of the current Covid19 crisis, you are scheduled for a (phone or video) visit with your provider on (date) at (time).  Just as we do with many in-office visits, in order for you to participate in this visit, we must obtain consent.  If you'd like, I can send this to your mychart (if signed up) or email for you to review.  Otherwise, I can obtain your verbal consent now.  All virtual visits are billed to your insurance company just like a normal visit would be.  By agreeing to a virtual visit, we'd like you to understand that the technology does not allow for your provider to perform an examination, and thus may limit your provider's ability to fully assess your condition. If your provider identifies any concerns that need to be evaluated in person, we will make arrangements to do so.  Finally, though the technology is pretty good, we cannot assure that it will always work on either your or our end, and in the setting of a video visit, we may have to convert it to a phone-only visit.  In either situation, we cannot ensure that we have a secure connection.  Are you willing to proceed?" STAFF: Did the patient verbally acknowledge consent to telehealth visit? Document  YES/NO here: Yes  4. Advise patient to be prepared - "Two hours prior to your appointment, go ahead and check your blood pressure, pulse, oxygen saturation, and your weight (if you have the equipment to check those) and write them all down. When your visit starts, your provider will ask you for this information. If you have an Apple Watch or Kardia device, please plan to have heart rate information ready on the day of your appointment. Please have a pen and paper handy nearby the day of the visit as well."  5. Give patient instructions for MyChart download to smartphone OR [email or web address] as below if video visit (depending on what platform provider is using)  6. Inform patient they will receive a phone call 15 minutes prior to their appointment time (may be from unknown caller ID) so they should be prepared to answer    TELEPHONE CALL NOTE  [name] has been deemed a candidate for a follow-up tele-health visit to limit community exposure during the Covid-19 pandemic. I spoke with the patient via phone to ensure availability of phone/video source, confirm preferred email & phone number, and discuss instructions and expectations.  I reminded [name] to be prepared with any vital sign and/or heart rhythm information that could potentially be obtained via home monitoring, at the time of his visit. I reminded [name] to expect a phone call prior to  his visit.  Therisa Doyne 03/29/2019 1:05 PM   Pt stated he does have a smartphone for video visit. Also stated he does have a bp cuff and weight scale at home. Pt wife stated she has been unable to access his MyChart. We reset the password and she stated she will try again this afternoon and call if unsuccessful.  IF USING DOXIMITY or DOXY.ME - The patient will receive a link just prior to their visit by text.     FULL LENGTH CONSENT FOR TELE-HEALTH VISIT   I hereby voluntarily request, consent and authorize [name]  and its employed or contracted physicians, physician assistants, nurse practitioners or other licensed health care professionals (the Practitioner), to provide me with telemedicine health care services (the Services") as deemed necessary by the treating Practitioner. I acknowledge and consent to receive the Services by the Practitioner via telemedicine. I understand that the telemedicine visit will involve communicating with the Practitioner through live audiovisual communication technology and the disclosure of certain medical information by electronic transmission. I acknowledge that I have been given the opportunity to request an in-person assessment or other available alternative prior to the telemedicine visit and am voluntarily participating in the telemedicine visit.  I understand that I have the right to withhold or withdraw my consent to the use of telemedicine in the course of my care at any time, without affecting my right to future care or treatment, and that the Practitioner or I may terminate the telemedicine visit at any time. I understand that I have the right to inspect all information obtained and/or recorded in the course of the telemedicine visit and may receive copies of available information for a reasonable fee.  I understand that some of the potential risks of receiving the Services via telemedicine include:   Delay or interruption in medical evaluation due to technological equipment failure or disruption;  Information transmitted may not be sufficient (e.g. poor resolution of images) to allow for appropriate medical decision making by the Practitioner; and/or   In rare instances, security protocols could fail, causing a breach of personal health information.  Furthermore, I acknowledge that it is my responsibility to provide information about my medical history, conditions and care that is complete and accurate to the best of my ability. I acknowledge that Practitioner's advice,  recommendations, and/or decision may be based on factors not within their control, such as incomplete or inaccurate data provided by me or distortions of diagnostic images or specimens that may result from electronic transmissions. I understand that the practice of medicine is not an exact science and that Practitioner makes no warranties or guarantees regarding treatment outcomes. I acknowledge that I will receive a copy of this consent concurrently upon execution via email to the email address I last provided but may also request a printed copy by calling the office of [facility].    I understand that my insurance will be billed for this visit.   I have read or had this consent read to me.  I understand the contents of this consent, which adequately explains the benefits and risks of the Services being provided via telemedicine.   I have been provided ample opportunity to ask questions regarding this consent and the Services and have had my questions answered to my satisfaction.  I give my informed consent for the services to be provided through the use of telemedicine in my medical care  By participating in this telemedicine visit I agree to the above.

## 2019-03-30 ENCOUNTER — Other Ambulatory Visit: Payer: Self-pay

## 2019-03-30 ENCOUNTER — Encounter: Payer: Self-pay | Admitting: Cardiovascular Disease

## 2019-03-30 ENCOUNTER — Telehealth (INDEPENDENT_AMBULATORY_CARE_PROVIDER_SITE_OTHER): Payer: Medicare Other | Admitting: Cardiovascular Disease

## 2019-03-30 VITALS — BP 102/63 | HR 84 | Ht 70.0 in | Wt 200.0 lb

## 2019-03-30 DIAGNOSIS — I2584 Coronary atherosclerosis due to calcified coronary lesion: Secondary | ICD-10-CM

## 2019-03-30 DIAGNOSIS — I452 Bifascicular block: Secondary | ICD-10-CM

## 2019-03-30 DIAGNOSIS — E785 Hyperlipidemia, unspecified: Secondary | ICD-10-CM

## 2019-03-30 DIAGNOSIS — I10 Essential (primary) hypertension: Secondary | ICD-10-CM | POA: Diagnosis not present

## 2019-03-30 DIAGNOSIS — K219 Gastro-esophageal reflux disease without esophagitis: Secondary | ICD-10-CM

## 2019-03-30 DIAGNOSIS — I2510 Atherosclerotic heart disease of native coronary artery without angina pectoris: Secondary | ICD-10-CM

## 2019-03-30 DIAGNOSIS — R0600 Dyspnea, unspecified: Secondary | ICD-10-CM

## 2019-03-30 DIAGNOSIS — R0609 Other forms of dyspnea: Secondary | ICD-10-CM

## 2019-03-30 NOTE — Addendum Note (Signed)
Addended by: Caprice Beaver T on: 03/30/2019 08:55 AM   Modules accepted: Orders

## 2019-03-30 NOTE — Patient Instructions (Signed)
Medication Instructions:  The current medical regimen is effective;  continue present plan and medications.  If you need a refill on your cardiac medications before your next appointment, please call your pharmacy.   Lab work: CMET, LIPID in 4 weeks. If you have labs (blood work) drawn today and your tests are completely normal, you will receive your results only by: Marland Kitchen MyChart Message (if you have MyChart) OR . A paper copy in the mail If you have any lab test that is abnormal or we need to change your treatment, we will call you to review the results.  Follow-Up: At [facility], you and your health needs are our priority.  As part of our continuing mission to provide you with exceptional heart care, we have created designated Provider Care Teams.  These Care Teams include your primary Cardiologist (physician) and Advanced Practice Providers (APPs -  Physician Assistants and Nurse Practitioners) who all work together to provide you with the care you need, when you need it. You will need a follow up appointment in 6 months.  Please call our office 2 months in advance to schedule this appointment.  You may see Dr.Kelly or one of the following Advanced Practice Providers on your designated Care Team: Almyra Deforest, [place] . Fabian Sharp, PA-C  .

## 2019-03-30 NOTE — Progress Notes (Signed)
Virtual Visit via Video Note   This visit type was conducted due to national recommendations for restrictions regarding the COVID-19 Pandemic (e.g. social distancing) in an effort to limit this patient's exposure and mitigate transmission in our community.  Due to his co-morbid illnesses, this patient is at least at moderate risk for complications without adequate follow up.  This format is felt to be most appropriate for this patient at this time.  All issues noted in this document were discussed and addressed.  A limited physical exam was performed with this format.  Please refer to the patient's chart for his consent to telehealth for [facility].   Evaluation Performed:  Follow-up visit  Date:  03/30/2019   ID:  [name], [name] [birth year], MRN [id number]  Patient Location: Home Provider Location: Home  PCP:  Prince Solian, MD  Cardiologist:  Shelva Majestic, MD Electrophysiologist:  None   Chief Complaint: 1-month follow-up evaluation  History of Present Illness:    [name] is a 78 y.o. male who was self-referred for evaluation of exertional shortness of breath.  I saw him for initial evaluation in October 21, 2018.  He presents for follow-up evaluation.  [name] has a history of hypertension and has been on olmesartan 20 mg daily.  He also has a history of GERD for which he takes omeprazole.  He states in the past he was on medication for hyperlipidemia and had taken simvastatin but he has not taken this in greater than 6 months.  He has a history of mild shortness of breath with activity.  More recently he has noticed perhaps slight increase with the shortness of breath particularly with uphill walking.  He hunts for his leisure and often notes shortness of breath with moving fast up hills.  He denies any definitive only associated chest heaviness.  He denies any PND, orthopnea, presyncope or syncope and denies any awareness of palpitations.  He has strong family  history for heart disease that his mother had suffered a stroke and also had CABG revascularization surgery.  All 4 of his brothers have coronary artery disease of which 2 are deceased, one who had CABG revascularization surgery and another who had a MI.  His 2 living brothers have had coronary stents.  He has remote tobacco history and had smoked for approximately 15  years but quit approximately 40 years ago.   When I initially saw him, I recommended that he undergo a 2D echo Doppler study to evaluate systolic and diastolic function in addition to his valvular architecture.  With his significant CAD family history of also recommended a coronary CT angiogram for further evaluation of exertional dyspnea to make certain it was not ischemia mediated.  His echo Doppler study was done on October 26, 2018 and showed an EF of 60 to 65%.  He had grade 1 diastolic dysfunction.  There was mild aortic regurgitation.  PA pressure was mildly increased at 33 mm.  Coronary CTA yielded a calcium score of 12 which is the 8th percentile for age and sex.  He was noted to have mild coronary plaque that was nonobstructive with less than 50% mixed plaque in the distal left main, less than 30% mixed plaque in the proximal LAD, less than 30% soft plaque in the proximal circumflex and less than 30% mixed plaque in the mid RCA.  Lipid studies were notable for an LDL cholesterol of 124.  He has not been on any lipid-lowering therapy.  His  resting pulse typically is elevated.  He still notes mild shortness of breath with walking.   When I last saw him in January 2020 in light of evidence for mild coronary plaque w well today saying that everything as you know I saw you in follow-up for [name] that was e discussed the importance of aggressive lipid intervention to potentially induce plaque regression.  As result I initiated therapy with rosuvastatin 20 mg.  In addition, his resting pulse was 99.  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  I recommended the addition of low-dose metoprolol succinate at 25 mg which would help his to lower his resting heart rate and attenuate his submaximal heart rate with activity.  Over the past several months, he has felt well.  He has been taking the rosuvastatin but he was concerned perhaps that the 20 mg dose was attributing to some of his reflux.  As result he self reduced it to 10 mg for several weeks.  Apparently he had spoken to a nurse recently and for the past week has been back on the 20 mg regimen.  He has noticed his resting pulse is improved with the addition of low-dose metoprolol.  His shortness of breath is slightly improved.  He denies chest pain PND orthopnea or palpitations.  The patient does not have symptoms concerning for COVID-19 infection (fever, chills, cough, or new shortness of breath).    Past Medical History:  Diagnosis Date   Allergy    Appendicitis    Cholecystitis    Cholelithiasis    Diverticulosis    Gallstones    GERD (gastroesophageal reflux disease)    H pylori ulcer    Hemorrhoids    Hiatal hernia    Hyperlipidemia    Hypertension    Kidney stones    Peptic ulcer    history of   Past Surgical History:  Procedure Laterality Date   APPENDECTOMY     CHOLECYSTECTOMY     COLONOSCOPY     INGUINAL HERNIA REPAIR     left   KIDNEY STONE SURGERY     lithotripsy and removal of kidney stone   UMBILICAL HERNIA REPAIR       Current Meds  Medication Sig   Ascorbic Acid (VITAMIN C PO) Take 500 mg by mouth daily.    aspirin EC 81 MG tablet Take 81 mg by mouth daily.   calcium carbonate (TUMS - DOSED IN MG ELEMENTAL CALCIUM) 500 MG chewable tablet Chew 2 tablets by mouth at bedtime.   Cholecalciferol 125 MCG (5000 UT) capsule Take 5,000 Units by mouth daily.   Cyanocobalamin (VITAMIN B-12) 5000 MCG TBDP Take 5,000 mcg by mouth daily.   metoprolol succinate (TOPROL-XL) 25 MG  24 hr tablet Take 1 tablet (25 mg total) by mouth daily.   metoprolol tartrate (LOPRESSOR) 50 MG tablet TAKE 1 TABLET BY MOUTH TWO HOURS PRIOR TO CT   Multiple Vitamins-Minerals (OCUVITE PO) Take by mouth daily at 6 (six) AM.   olmesartan (BENICAR) 20 MG tablet Take 20 mg by mouth daily.   omeprazole (PRILOSEC OTC) 20 MG tablet Take 20 mg by mouth daily.   Current Facility-Administered Medications for the 03/30/19 encounter (Telemedicine) with Troy Sine, MD  Medication   0.9 %  sodium chloride infusion     Allergies:   Penicillins   Social History   Tobacco Use   Smoking status: Former Smoker    Types: Cigarettes  Last attempt to quit: 12/01/1978    Years since quitting: 40.3   Smokeless tobacco: Former Systems developer    Quit date: 12/02/1975  Substance Use Topics   Alcohol use: Yes    Comment: ocassional wine  and beer   Drug use: No    Additional social history is notable that he is married for 57 years.  He has 2 children, 5 grandchildren, and 4 great-grandchildren.  He completed ninth grade but received his GED degree.  He had worked for the [employer].  He is retired.  Family Hx: The patient's family history includes CAD in his mother; CVA in his mother and another family member; Diabetes in his brother, brother, father, mother, sister, and another family member; Fibromyalgia in his daughter; Heart attack in his brother and brother; Heart disease in his brother, brother, brother, brother, and mother; Hypertension in his mother and son; Stomach cancer in his paternal grandfather; Stroke in his maternal grandmother and paternal grandmother. There is no history of Colon cancer.  ROS:   Please see the history of present illness.     General: Negative; No fevers, chills, or night sweats;  HEENT: Negative; No changes in vision or hearing, sinus congestion, difficulty swallowing Pulmonary: Negative; No cough, wheezing, shortness of breath, hemoptysis Cardiovascular:   See HPI GI: Negative; No nausea, vomiting, diarrhea, or abdominal pain GU: Negative; No dysuria, hematuria, or difficulty voiding Musculoskeletal: Negative; no myalgias, joint pain, or weakness Hematologic/Oncology: Negative; no easy bruising, bleeding Endocrine: Negative; no heat/cold intolerance; no diabetes Neuro: Negative; no changes in balance, headaches Skin: Negative; No rashes or skin lesions Psychiatric: Negative; No behavioral problems, depression Sleep: Negative; No snoring, daytime sleepiness, hypersomnolence, bruxism, restless legs, hypnogognic hallucinations, no cataplexy Other comprehensive 14 point system review is negative. All other systems reviewed and are negative.   Prior CV studies:   The following studies were reviewed today:  ------------------------------------------------------------------- ECHO Study Conclusions: 10/26/2018  - Left ventricle: There was mild concentric hypertrophy. Systolic   function was normal. The estimated ejection fraction was in the   range of 60% to 65%. Doppler parameters are consistent with   abnormal left ventricular relaxation (grade 1 diastolic   dysfunction). Doppler parameters are consistent with   indeterminate ventricular filling pressure. - Aortic valve: There was mild regurgitation. Regurgitation   pressure half-time: 518 ms. - Left atrium: The atrium was mildly dilated. - Right ventricle: The cavity size was normal. Wall thickness was   normal. Systolic function was normal. - Atrial septum: No defect or patent foramen ovale was identified   by color flow Doppler. - Tricuspid valve: There was trivial regurgitation. - Pulmonary arteries: PA peak pressure: 33 mm Hg (S).  CTA FINDINGS: 11/23/2018 Non-cardiac: See separate report from [facility]. No significant findings on limited lung and soft tissue windows.  Calcium score: Mild calcium noted in mid RCA and distal LM proximal LAD  Coronary Arteries:  Right dominant with no anomalies  LM: Less than 50% mixed plaque in distal LM  LAD: Less than 30% mixed plaque in proximal LAD  D1: Normal  D2: Normal  Circumflex: Less than 30% soft plaque proximally  OM1: Normal small vessel  OM2: Normal large vessel  RCA: Less than 30% mixed plaque in mid vessel  PDA: Normal  PLA: Normal  IMPRESSION: 1.  Calcium score only 12 which is 8 th percentile for age and sex  2.  Normal ascending aortic root 3.5 cm  3. Non obstructive CAD see description above Study  not suitable for FFR CT due to marked misregistration artifact   Labs/Other Tests and Data Reviewed:    EKG:  An ECG dated 12/28/2018 was personally reviewed today and demonstrated:  Normal sinus rhythm at 99 bpm.  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  PR interval 176 ms; QTc interval 492 ms   Recent Labs: 11/16/2018: ALT 20; BUN 12; Creatinine, Ser 0.89; Hemoglobin 14.8; Platelets 148; Potassium 5.0; Sodium 140; TSH 2.740   Recent Lipid Panel Lab Results  Component Value Date/Time   CHOL 191 11/16/2018 09:14 AM   TRIG 100 11/16/2018 09:14 AM   HDL 47 11/16/2018 09:14 AM   CHOLHDL 4.1 11/16/2018 09:14 AM   LDLCALC 124 (H) 11/16/2018 09:14 AM    Wt Readings from Last 3 Encounters:  03/30/19 200 lb (90.7 kg)  12/28/18 210 lb (95.3 kg)  10/21/18 206 lb 12.8 oz (93.8 kg)     Objective:    Vital Signs:  BP 102/63    Pulse 84    Ht 5\' 10"  (1.778 m)    Wt 200 lb (90.7 kg)    BMI 28.70 kg/m    He had taken a repeat blood pressure this morning about an hour and a history of taking his medications and this was 118/66 and his resting pulse was 76  On exam he is well-developed and well-nourished in no acute distress HEENT is unremarkable He did not appear to have any neck vein distention His respirations were normal and unlabored.  There was no audible wheezing He took his pulse and his heart rate was regular in the  70s without irregularity; there was no chest wall tenderness to palpation His abdomen was nontender to palpation. He denied any significant muscle tenderness There was no leg edema Neurologic fully he appeared nonfocal He had a normal affect and mood  ASSESSMENT & PLAN:    1. Essential hypertension: His blood pressure today is improved and stable on his regimen now consisting of olmesartan in addition to metoprolol succinate 25 mg. 2. Grade 1 diastolic dysfunction: His exertional dyspnea has improved.  His resting heart rate has improved with the addition of metoprolol succinate 25 mg.  There are no palpitations.   3. Hyperlipidemia: LDL cholesterol was 124.  Rosuvastatin was implemented initially at 20 mg but he had taken for over a month but then had self reduced to 10 mg.  He is now back on the 20 mg dosing for at least 1 to 2 weeks.  I previously discussed with him the importance of achieving LDL of less than 70 in attempt to induce plaque regression. 4. Mild coronary atherosclerosis: Coronary CT angiogram again reviewed with patient.  Discussed the importance of aggressive lipid therapy in attempt to induce plaque regression 5. 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  ECG unable to be obtained today by telemedicine.  However, ventricular rate is stable in the 70s states. 6. GERD: Continues to be on omeprazole.  I discussed with him that the GERD should not be exacerbated with statin use.  COVID-19 Education: The signs and symptoms of COVID-19 were discussed with the patient and how to seek care for testing (follow up with PCP or arrange E-visit).  The importance of social distancing was discussed today.  Time:   Today, I have spent 25 minutes with the patient with telehealth technology discussing the above problems.     Medication Adjustments/Labs and Tests Ordered: Current medicines are reviewed at length  with the patient today.  Concerns regarding  medicines are outlined above.   Tests Ordered: No orders of the defined types were placed in this encounter.   Medication Changes: No orders of the defined types were placed in this encounter.   Disposition:  Follow up; the patient will undergo a comprehensive metabolic panel and lipid studies in 1 month.  I will contact him regarding the results of medication adjustment as necessary.  I will see him for follow-up face-to-face office visit in 6 months  Signed, Shelva Majestic, MD  03/30/2019 8:36 AM    [place]

## 2019-05-03 LAB — LIPID PANEL
Chol/HDL Ratio: 2.6 ratio (ref 0.0–5.0)
Cholesterol, Total: 102 mg/dL (ref 100–199)
HDL: 40 mg/dL (ref >39)
LDL Calculated: 49 mg/dL (ref 0–99)
Triglycerides: 66 mg/dL (ref 0–149)
VLDL Cholesterol Cal: 13 mg/dL (ref 5–40)

## 2019-05-03 LAB — COMPREHENSIVE METABOLIC PANEL
ALT: 31 IU/L (ref 0–44)
AST: 30 IU/L (ref 0–40)
Albumin/Globulin Ratio: 2 (ref 1.2–2.2)
Albumin: 4.3 g/dL (ref 3.7–4.7)
Alkaline Phosphatase: 73 IU/L (ref 39–117)
BUN/Creatinine Ratio: 22 (ref 10–24)
BUN: 22 mg/dL (ref 8–27)
Bilirubin Total: 0.8 mg/dL (ref 0.0–1.2)
CO2: 24 mmol/L (ref 20–29)
Calcium: 9.5 mg/dL (ref 8.6–10.2)
Chloride: 100 mmol/L (ref 96–106)
Creatinine, Ser: 1.02 mg/dL (ref 0.76–1.27)
GFR calc Af Amer: 81 mL/min/{1.73_m2} (ref >59)
GFR calc non Af Amer: 70 mL/min/{1.73_m2} (ref >59)
Globulin, Total: 2.1 g/dL (ref 1.5–4.5)
Glucose: 87 mg/dL (ref 65–99)
Potassium: 5 mmol/L (ref 3.5–5.2)
Sodium: 139 mmol/L (ref 134–144)
Total Protein: 6.4 g/dL (ref 6.0–8.5)

## 2019-09-15 ENCOUNTER — Other Ambulatory Visit: Payer: Self-pay | Admitting: Cardiovascular Disease

## 2019-09-22 ENCOUNTER — Emergency Department (HOSPITAL_COMMUNITY): Payer: Medicare Other

## 2019-09-22 ENCOUNTER — Other Ambulatory Visit: Payer: Self-pay

## 2019-09-22 ENCOUNTER — Ambulatory Visit
Admission: EM | Admit: 2019-09-22 | Discharge: 2019-09-22 | Disposition: A | Discharge status: Home / Self Care | Payer: Medicare Other | Source: Home / Self Care

## 2019-09-22 ENCOUNTER — Encounter: Payer: Self-pay | Admitting: Emergency Medicine

## 2019-09-22 ENCOUNTER — Emergency Department (HOSPITAL_COMMUNITY)
Admission: EM | Admit: 2019-09-22 | Discharge: 2019-09-22 | Disposition: A | Discharge status: Home / Self Care | Payer: Medicare Other | Attending: Emergency Medicine | Admitting: Emergency Medicine

## 2019-09-22 ENCOUNTER — Encounter (HOSPITAL_COMMUNITY): Payer: Self-pay | Admitting: *Deleted

## 2019-09-22 DIAGNOSIS — Z79899 Other long term (current) drug therapy: Secondary | ICD-10-CM | POA: Insufficient documentation

## 2019-09-22 DIAGNOSIS — W228XXA Striking against or struck by other objects, initial encounter: Secondary | ICD-10-CM

## 2019-09-22 DIAGNOSIS — Y998 Other external cause status: Secondary | ICD-10-CM | POA: Diagnosis not present

## 2019-09-22 DIAGNOSIS — Y929 Unspecified place or not applicable: Secondary | ICD-10-CM | POA: Diagnosis not present

## 2019-09-22 DIAGNOSIS — S61412A Laceration without foreign body of left hand, initial encounter: Secondary | ICD-10-CM | POA: Insufficient documentation

## 2019-09-22 DIAGNOSIS — Z87891 Personal history of nicotine dependence: Secondary | ICD-10-CM | POA: Insufficient documentation

## 2019-09-22 DIAGNOSIS — Y9389 Activity, other specified: Secondary | ICD-10-CM | POA: Insufficient documentation

## 2019-09-22 DIAGNOSIS — S6992XA Unspecified injury of left wrist, hand and finger(s), initial encounter: Secondary | ICD-10-CM | POA: Diagnosis present

## 2019-09-22 DIAGNOSIS — W268XXA Contact with other sharp object(s), not elsewhere classified, initial encounter: Secondary | ICD-10-CM | POA: Insufficient documentation

## 2019-09-22 DIAGNOSIS — Z7982 Long term (current) use of aspirin: Secondary | ICD-10-CM | POA: Insufficient documentation

## 2019-09-22 DIAGNOSIS — I10 Essential (primary) hypertension: Secondary | ICD-10-CM | POA: Diagnosis not present

## 2019-09-22 MED ORDER — IBUPROFEN 400 MG PO TABS
600.0000 mg | ORAL_TABLET | Freq: Once | ORAL | Status: DC
  Filled 2019-09-22: qty 1

## 2019-09-22 MED ORDER — ACETAMINOPHEN 325 MG PO TABS
650.0000 mg | ORAL_TABLET | Freq: Once | ORAL | Status: AC
  Administered 2019-09-22: 20:00:00 650 mg via ORAL
  Filled 2019-09-22: qty 2

## 2019-09-22 MED ORDER — LIDOCAINE-EPINEPHRINE (PF) 2 %-1:200000 IJ SOLN
10.0000 mL | Freq: Once | INTRAMUSCULAR | Status: AC
  Administered 2019-09-22: 10 mL
  Filled 2019-09-22: qty 20

## 2019-09-22 NOTE — ED Provider Notes (Signed)
[place] EMERGENCY DEPARTMENT Provider Note   CSN: [id number] Arrival date & time: 09/22/19  1624     History   Chief Complaint Chief Complaint  Patient presents with  . Extremity Laceration    HPI [name] is a 78 y.o. male presenting to the ED with left hand laceration.  He reports he dropped some broken ceramic tiles onto his left hand earlier today.  He washed the wound and came to urgent care initially, where there was concern that bleeding could not be staunched, and he was sent to ED for further evaluation.  Patient takes 81 mg aspirin daily, no other forms of anticoagulation.  He denies numbness in his fingers.  He reports to me that his tetanus is up to date per his PCP.     HPI  Past Medical History:  Diagnosis Date  . Allergy   . Appendicitis   . Cholecystitis   . Cholelithiasis   . Diverticulosis   . Gallstones   . GERD (gastroesophageal reflux disease)   . H pylori ulcer   . Hemorrhoids   . Hiatal hernia   . Hyperlipidemia   . Hypertension   . Kidney stones   . Peptic ulcer    history of    There are no active problems to display for this patient.   Past Surgical History:  Procedure Laterality Date  . APPENDECTOMY    . CHOLECYSTECTOMY    . COLONOSCOPY    . INGUINAL HERNIA REPAIR     left  . KIDNEY STONE SURGERY     lithotripsy and removal of kidney stone  . UMBILICAL HERNIA REPAIR          Home Medications    Prior to Admission medications   Medication Sig Start Date End Date Taking? Authorizing Provider  Ascorbic Acid (VITAMIN C PO) Take 500 mg by mouth daily.     [provider]  aspirin EC 81 MG tablet Take 81 mg by mouth daily.    [provider]  calcium carbonate (TUMS - DOSED IN MG ELEMENTAL CALCIUM) 500 MG chewable tablet Chew 2 tablets by mouth at bedtime.    [provider]  Cholecalciferol 125 MCG (5000 UT) capsule Take 5,000 Units by mouth daily.    [provider]  Cyanocobalamin (VITAMIN B-12) 5000 MCG TBDP Take 5,000 mcg by mouth daily.    [provider]  metoprolol succinate (TOPROL-XL) 25 MG 24 hr tablet TAKE 1 TABLET(25 MG) BY MOUTH DAILY 09/16/19   Troy Sine, MD  metoprolol tartrate (LOPRESSOR) 50 MG tablet TAKE 1 TABLET BY MOUTH TWO HOURS PRIOR TO CT 10/21/18   Troy Sine, MD  Multiple Vitamins-Minerals (OCUVITE PO) Take by mouth daily at 6 (six) AM.    [provider]  olmesartan (BENICAR) 20 MG tablet Take 20 mg by mouth daily.    [provider]  omeprazole (PRILOSEC OTC) 20 MG tablet Take 20 mg by mouth daily.    [provider]  rosuvastatin (CRESTOR) 20 MG tablet Take 1 tablet (20 mg total) by mouth daily. 12/28/18 03/28/19  Troy Sine, MD    Family History Family History  Problem Relation Age of Onset  . CAD Mother   . Hypertension Mother   . CVA Mother   . Diabetes Mother        grandparents  . Heart disease Mother   . CVA Other        grandparents  .  Fibromyalgia Daughter   . Hypertension Son   . Stomach cancer Paternal Grandfather   . Diabetes Father   . Diabetes Other        siblings x6  . Heart attack Brother   . Diabetes Brother   . Heart disease Brother   . Stroke Maternal Grandmother   . Stroke Paternal Grandmother   . Diabetes Sister   . Heart attack Brother   . Diabetes Brother   . Heart disease Brother   . Heart disease Brother   . Heart disease Brother   . Colon cancer Neg Hx     Social History Social History   Tobacco Use  . Smoking status: Former Smoker    Types: Cigarettes    Quit date: 12/01/1978    Years since quitting: 40.8  . Smokeless tobacco: Former Systems developer    Quit date: 12/02/1975  Substance Use Topics  . Alcohol use: Yes    Comment: ocassional wine  and beer  . Drug use: No     Allergies   Penicillins   Review of Systems Review of Systems  Musculoskeletal: Positive for arthralgias and myalgias.  Skin: Positive for wound. Negative  for rash.  Neurological: Negative for weakness and numbness.     Physical Exam Updated Vital Signs BP 126/82 (BP Location: Right Arm)   Pulse 63   Temp 98.3 F (36.8 C) (Oral)   Resp 16   Wt 90.7 kg   SpO2 100%   BMI 28.70 kg/m   Physical Exam Vitals signs and nursing note reviewed.  Constitutional:      Appearance: He is well-developed.  HENT:     Head: Normocephalic and atraumatic.  Eyes:     Conjunctiva/sclera: Conjunctivae normal.  Neck:     Musculoskeletal: Neck supple.  Cardiovascular:     Rate and Rhythm: Normal rate and regular rhythm.     Pulses: Normal pulses.  Pulmonary:     Effort: Pulmonary effort is normal. No respiratory distress.  Musculoskeletal:     Comments: Full ROM at the fingers  Skin:    General: Skin is warm and dry.     Capillary Refill: Capillary refill takes less than 2 seconds.     Comments: 2 cm linear laceration between the 3rd and 4th digit of the left hand (interphalangeal web space), oozing blood on exam, no pulsatile bleeding On exploration of wound no damage to underlying tendons or nerves No foreign bodies identified in wound  Neurological:     General: No focal deficit present.     Mental Status: He is alert.     Sensory: No sensory deficit.      ED Treatments / Results  Labs (all labs ordered are listed, but only abnormal results are displayed) Labs Reviewed - No data to display  EKG None  Radiology Dg Hand Complete Left  Result Date: 09/22/2019 CLINICAL DATA:  Hand laceration between left ring and middle finger. EXAM: LEFT HAND - COMPLETE 3+ VIEW COMPARISON:  None FINDINGS: A dressing is seen between the ring and middle finger of the left hand. No signs of radiopaque foreign body. No signs of fracture. Marked degenerative changes are noted in the first interphalangeal joint. IMPRESSION: Dressing between the ring and middle finger of the left hand. No fracture or radiopaque foreign body. Electronically Signed   By:  Zetta Bills M.D.   On: 09/22/2019 17:53    Procedures .Marland KitchenLaceration Repair  Date/Time: 09/22/2019 8:35 PM Performed by: Wyvonnia Dusky, MD Authorized  by: Wyvonnia Dusky, MD   Consent:    Consent obtained:  Verbal   Consent given by:  Patient   Risks discussed:  Infection and pain Anesthesia (see MAR for exact dosages):    Anesthesia method:  Local infiltration   Local anesthetic:  Lidocaine 2% WITH epi Laceration details:    Location:  Hand   Hand location:  L palm   Length (cm):  3   Depth (mm):  3 Repair type:    Repair type:  Intermediate Pre-procedure details:    Preparation:  Patient was prepped and draped in usual sterile fashion Exploration:    Hemostasis achieved with:  Epinephrine and direct pressure   Wound exploration: wound explored through full range of motion and entire depth of wound probed and visualized     Contaminated: no   Treatment:    Area cleansed with:  Saline   Amount of cleaning:  Standard   Irrigation solution:  Sterile saline   Visualized foreign bodies/material removed: no   Skin repair:    Repair method:  Sutures   Suture size:  4-0   Suture material:  Prolene   Suture technique:  Simple interrupted   Number of sutures:  6 Approximation:    Approximation:  Close Post-procedure details:    Dressing:  Sterile dressing   Patient tolerance of procedure:  Tolerated well, no immediate complications   (including critical care time)  Medications Ordered in ED Medications  acetaminophen (TYLENOL) tablet 650 mg (650 mg Oral Given 09/22/19 1949)  lidocaine-EPINEPHrine (XYLOCAINE W/EPI) 2 %-1:200000 (PF) injection 10 mL (10 mLs Infiltration Given 09/22/19 1950)     Initial Impression / Assessment and Plan / ED Course  I have reviewed the triage vital signs and the nursing notes.  Pertinent labs & imaging results that were available during my care of the patient were reviewed by me and considered in my medical decision making (see  chart for details).  78 yo male here with laceration to left hand from a work accident earlier today, dropped some tiles on his hand  He is neurovascularly intact No evidence of arterial injury on exam His bleeding is largely staunched by pressure bandage applied at urgent care Wound was fully explored Xray shows no retained foreign body  Plan for irrigation, simple laceration repair Do not believe he needs antibiotics for this, no sign of infection of the wound, no sign of flexor tenosynovitis  He is right handed Tetanus is reportedly up to date per the patient  Advised him to f/u with PCP in 7-10 days for suture removal and wound re-examination  Final Clinical Impressions(s) / ED Diagnoses   Final diagnoses:  Laceration of left hand without foreign body, initial encounter    ED Discharge Orders    None       Barnaby Rippeon, Carola Rhine, MD 09/23/19 [phone]

## 2019-09-22 NOTE — ED Triage Notes (Signed)
Pt was sent here from [facility] due to deep laceration on left hand between left ring and middle finger.  Dressing in place on arrival, wound began bleeding when I attempted to look at it, dressing placed back on hand.  This laceration was from a broken piece of porcelain.  Pt states that he is unsure when his last tetanus shot was.

## 2019-09-22 NOTE — ED Triage Notes (Addendum)
Pt presents to [facility] for assessment of laceration between middle and ring finger of left hand less than an hour ago.  Unsure of last tetanus but states "my PCP usually keeps me up to date".  Bleeding controlled.

## 2019-09-22 NOTE — Discharge Instructions (Addendum)
You should have your six (6) stitches removed in 7-10 days.  This can be done by your primary care doctor, an urgent care, or here in the ER.

## 2019-09-22 NOTE — ED Provider Notes (Signed)
[facility]    CSN: [id number] Arrival date & time: 09/22/19  1502      History   Chief Complaint Chief Complaint  Patient presents with  . Laceration    HPI [name] is a 78 y.o. male.   78 year old male comes in for laceration between the third and fourth left hand that occurred shortly prior to arrival.  A bucket full of broken tiles fell onto the hand.  He rinsed the wound with water, and came in for evaluation.  He has a towel wrapped between fingers to help with bleeding.  Denies numbness, tingling.  Able to move fingers.  Unsure last tetanus shot. On aspirin 81mg , no other blood thinners.      Past Medical History:  Diagnosis Date  . Allergy   . Appendicitis   . Cholecystitis   . Cholelithiasis   . Diverticulosis   . Gallstones   . GERD (gastroesophageal reflux disease)   . H pylori ulcer   . Hemorrhoids   . Hiatal hernia   . Hyperlipidemia   . Hypertension   . Kidney stones   . Peptic ulcer    history of    There are no active problems to display for this patient.   Past Surgical History:  Procedure Laterality Date  . APPENDECTOMY    . CHOLECYSTECTOMY    . COLONOSCOPY    . INGUINAL HERNIA REPAIR     left  . KIDNEY STONE SURGERY     lithotripsy and removal of kidney stone  . UMBILICAL HERNIA REPAIR         Home Medications    Prior to Admission medications   Medication Sig Start Date End Date Taking? Authorizing Provider  Ascorbic Acid (VITAMIN C PO) Take 500 mg by mouth daily.     [provider]  aspirin EC 81 MG tablet Take 81 mg by mouth daily.    [provider]  calcium carbonate (TUMS - DOSED IN MG ELEMENTAL CALCIUM) 500 MG chewable tablet Chew 2 tablets by mouth at bedtime.    [provider]  Cholecalciferol 125 MCG (5000 UT) capsule Take 5,000 Units by mouth daily.    [provider]  Cyanocobalamin (VITAMIN B-12) 5000 MCG TBDP Take 5,000 mcg by mouth daily.    [provider]  metoprolol succinate (TOPROL-XL) 25 MG 24 hr tablet TAKE 1 TABLET(25 MG) BY MOUTH DAILY 09/16/19   Troy Sine, MD  metoprolol tartrate (LOPRESSOR) 50 MG tablet TAKE 1 TABLET BY MOUTH TWO HOURS PRIOR TO CT 10/21/18   Troy Sine, MD  Multiple Vitamins-Minerals (OCUVITE PO) Take by mouth daily at 6 (six) AM.    [provider]  olmesartan (BENICAR) 20 MG tablet Take 20 mg by mouth daily.    [provider]  omeprazole (PRILOSEC OTC) 20 MG tablet Take 20 mg by mouth daily.    [provider]  rosuvastatin (CRESTOR) 20 MG tablet Take 1 tablet (20 mg total) by mouth daily. 12/28/18 03/28/19  Troy Sine, MD    Family History Family History  Problem Relation Age of Onset  . CAD Mother   . Hypertension Mother   . CVA Mother   . Diabetes Mother        grandparents  . Heart disease Mother   . CVA Other        grandparents  . Fibromyalgia Daughter   . Hypertension Son   . Stomach cancer Paternal Grandfather   .  Diabetes Father   . Diabetes Other        siblings x6  . Heart attack Brother   . Diabetes Brother   . Heart disease Brother   . Stroke Maternal Grandmother   . Stroke Paternal Grandmother   . Diabetes Sister   . Heart attack Brother   . Diabetes Brother   . Heart disease Brother   . Heart disease Brother   . Heart disease Brother   . Colon cancer Neg Hx     Social History Social History   Tobacco Use  . Smoking status: Former Smoker    Types: Cigarettes    Quit date: 12/01/1978    Years since quitting: 40.8  . Smokeless tobacco: Former Systems developer    Quit date: 12/02/1975  Substance Use Topics  . Alcohol use: Yes    Comment: ocassional wine  and beer  . Drug use: No     Allergies   Penicillins   Review of Systems Review of Systems  Reason unable to perform ROS: See HPI as above.     Physical Exam Triage Vital Signs ED Triage Vitals  Enc Vitals Group     BP 09/22/19 1520 (!) 150/80     Pulse Rate 09/22/19  1520 77     Resp 09/22/19 1520 18     Temp 09/22/19 1520 98.8 F (37.1 C)     Temp Source 09/22/19 1520 Oral     SpO2 09/22/19 1520 98 %     Weight --      Height --      Head Circumference --      Peak Flow --      Pain Score 09/22/19 1519 2     Pain Loc --      Pain Edu? --      Excl. in [place]? --    No data found.  Updated Vital Signs BP (!) 150/80 (BP Location: Right Arm)   Pulse 77   Temp 98.8 F (37.1 C) (Oral)   Resp 18   SpO2 98%   Physical Exam Constitutional:      General: He is not in acute distress.    Appearance: He is well-developed. He is not diaphoretic.  HENT:     Head: Normocephalic and atraumatic.  Eyes:     Conjunctiva/sclera: Conjunctivae normal.     Pupils: Pupils are equal, round, and reactive to light.  Pulmonary:     Effort: Pulmonary effort is normal. No respiratory distress.  Musculoskeletal:     Comments: See picture below.  Approximately 2 cm laceration from between third and fourth finger down to the palm.  Bleeding controlled with pressure. However, significant bleeding once pressure dressing is removed.  No pulsating noted. Full ROM of fingers. Sensation intact and equal bilaterally.  Cap refill less than 2 seconds.  Tourniquet and blood pressure cuff used in attempts to control bleeding for better visualization of wound.  However, unable to control bleeding.  Still unable to assess wound depth.  Neurological:     Mental Status: He is alert and oriented to person, place, and time.           UC Treatments / Results  Labs (all labs ordered are listed, but only abnormal results are displayed) Labs Reviewed - No data to display  EKG   Radiology No results found.  Procedures Procedures (including critical care time)  Medications Ordered in UC Medications - No data to display  Initial Impression / Assessment and  Plan / UC Course  I have reviewed the triage vital signs and the nursing notes.  Pertinent labs & imaging results  that were available during my care of the patient were reviewed by me and considered in my medical decision making (see chart for details).    Discussed case with Dr Lanny Cramp. 78 year old male on aspirin 81mg  comes in for laceration between 3rd and 4th finger extending to palmar aspect of hand. Bleeding unable to be controlled despite multiple attempts with tourniquet/blood pressure cuff.  Unable to assess depth of wound, vascular damage.  Pressure dressing applied.  Patient discharged in stable condition to the emergency department for further evaluation and management needed.  Final Clinical Impressions(s) / UC Diagnoses   Final diagnoses:  Laceration of left hand, foreign body presence unspecified, initial encounter   ED Prescriptions    None     PDMP not reviewed this encounter.   Ok Edwards, PA-C 09/22/19 1557

## 2019-09-22 NOTE — Discharge Instructions (Addendum)
Laceration between 3rd and 4th finger of left hand. Bleeding unable to stop despite multiple attempts with tourniquet.  Unable to assess deep wound.  No pulsating.

## 2019-09-22 NOTE — ED Notes (Signed)
Patient able to ambulate independently  

## 2019-12-13 ENCOUNTER — Other Ambulatory Visit: Payer: Self-pay | Admitting: Cardiovascular Disease

## 2019-12-27 ENCOUNTER — Other Ambulatory Visit: Payer: Self-pay | Admitting: Cardiovascular Disease

## 2019-12-30 ENCOUNTER — Telehealth (INDEPENDENT_AMBULATORY_CARE_PROVIDER_SITE_OTHER): Payer: Medicare Other | Admitting: Cardiovascular Disease

## 2019-12-30 ENCOUNTER — Encounter: Payer: Self-pay | Admitting: Cardiovascular Disease

## 2019-12-30 VITALS — Ht 70.0 in

## 2019-12-30 DIAGNOSIS — I10 Essential (primary) hypertension: Secondary | ICD-10-CM | POA: Diagnosis not present

## 2019-12-30 DIAGNOSIS — I452 Bifascicular block: Secondary | ICD-10-CM

## 2019-12-30 DIAGNOSIS — K219 Gastro-esophageal reflux disease without esophagitis: Secondary | ICD-10-CM

## 2019-12-30 DIAGNOSIS — I5189 Other ill-defined heart diseases: Secondary | ICD-10-CM

## 2019-12-30 DIAGNOSIS — E785 Hyperlipidemia, unspecified: Secondary | ICD-10-CM

## 2019-12-30 DIAGNOSIS — I519 Heart disease, unspecified: Secondary | ICD-10-CM

## 2019-12-30 NOTE — Progress Notes (Signed)
Virtual Visit via Telephone Note   This visit type was conducted due to national recommendations for restrictions regarding the COVID-19 Pandemic (e.g. social distancing) in an effort to limit this patient's exposure and mitigate transmission in our community.  Due to his co-morbid illnesses, this patient is at least at moderate risk for complications without adequate follow up.  This format is felt to be most appropriate for this patient at this time.  The patient did not have access to video technology/had technical difficulties with video requiring transitioning to audio format only (telephone).  All issues noted in this document were discussed and addressed.  No physical exam could be performed with this format.  Please refer to the patient's chart for his  consent to telehealth for [facility].   Date:  12/30/2019   ID:  [name], DOB 1941/07/01, MRN [id number]  Patient Location: Other:  at dental school in [facility] Provider Location: Home  PCP:  Prince Solian, MD  Cardiologist:  Shelva Majestic, MD Electrophysiologist:  None   Evaluation Performed:  Follow-Up Visit  Chief Complaint:  9 month F/U  History of Present Illness:    [name] is a 79 y.o. male whoI saw him for initial evaluation in October 21, 2018 for shortness of breath. and last evaluated him  in April 2020.  He presents for 13-month follow-up telemedicine evaluation.  [name] has a history of hypertension and has been on olmesartan 20 mg daily. He also has a history of GERD for which he takes omeprazole. He states in the past he was on medication for hyperlipidemia and had taken simvastatin but he has not taken this in greater than 6 months. He has a history of mild shortness of breath with activity.  When I initially saw him he had recently noticed a slight increase with the shortness of breath particularly with uphill walking. 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 He denies any definitive only associated chest heaviness. He denies any PND, orthopnea, presyncope or syncope and denies any awareness of palpitations. He has strong family history for heart disease that his mother had suffered a stroke and also had CABG revascularization surgery. All 4 of his brothers have coronary artery disease of which 2 are deceased, one who had CABG revascularization surgery and another who had a MI. His 2 living brothers have had coronary stents. He has remote tobacco history and had smoked for approximately 15 years but quit approximately 40 years ago.   When I initially saw him, I recommended that he undergo a 2D echo Doppler study to evaluate systolic and diastolic function in addition to his valvular architecture. With his significant CAD family history of also recommended a coronary CT angiogram for further evaluation of exertional dyspnea to make certain it was not ischemia mediated. His echo Doppler study was done on October 26, 2018 and showed an EF of 60 to 65%. He had grade 1 diastolic dysfunction. There was mild aortic regurgitation. PA pressure was mildly increased at 33 mm. Coronary CTA yielded a calcium score of 12 which is the 8th percentile for age and sex. He was noted to have mild coronary plaque that was nonobstructive with less than 50% mixed plaque in the distal left main, less than 30% mixed plaque in the proximal LAD, less than 30% soft plaque in the proximal circumflex and less than 30% mixed plaque in the mid RCA. Lipidstudies were notable  for an LDL cholesterol of 124.He has not been on any lipid-lowering therapy. His resting pulse typically is elevated. He still notes mild shortness of breath with walking.   When I saw him in January 2020 in light of evidence for mild coronary plaque I discussed the importance of aggressive lipid intervention to potentially induce plaque regression. I initiated therapy with  rosuvastatin 20 mg.  In addition, his resting pulse was 99.  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  I recommended the addition of low-dose metoprolol succinate at 25 mg which would help his to lower his resting heart rate and attenuate his submaximal heart rate with activity.  I saw him in a telemedicine evaluation in April 2020.  At that time he continued to feel well.  Over the past several months, he has felt well.  He has been taking the rosuvastatin but he was concerned perhaps that the 20 mg dose was attributing to some of his reflux.  As result he self reduced it to 10 mg for several weeks.  Apparently he had spoken to a nurse recently and for the past week has been back on the 20 mg regimen.  He has noticed his resting pulse is improved with the addition of low-dose metoprolol.  His shortness of breath is slightly improved.  He denied chest pain,  PND, orthopnea or palpitations.  Over the past 9 months, [name] has continued to be stable.  He believes his pulse has been consistently in the 60s to low 70s.  His blood pressure has been stable.  He had repeat laboratory with Dr. Elsworth Soho.  LDL cholesterol had improved from 124 down to 49 and triglycerides from 191 down to 102 on rosuvastatin.  He tells me he will be undergoing repeat laboratory with Dr. Elsworth Soho in March 2021.  He continues to be active.  He denies any significant weight gain.  He presents for evaluation.   The patient does not have symptoms concerning for COVID-19 infection (fever, chills, cough, or new shortness of breath).    Past Medical History:  Diagnosis Date  . Allergy   . Appendicitis   . Cholecystitis   . Cholelithiasis   . Diverticulosis   . Gallstones   . GERD (gastroesophageal reflux disease)   . H pylori ulcer   . Hemorrhoids   . Hiatal hernia   . Hyperlipidemia   . Hypertension   . Kidney stones   . Peptic ulcer    history of   Past Surgical  History:  Procedure Laterality Date  . APPENDECTOMY    . CHOLECYSTECTOMY    . COLONOSCOPY    . INGUINAL HERNIA REPAIR     left  . KIDNEY STONE SURGERY     lithotripsy and removal of kidney stone  . UMBILICAL HERNIA REPAIR       Current Meds  Medication Sig  . Ascorbic Acid (VITAMIN C PO) Take 500 mg by mouth daily.   Marland Kitchen aspirin EC 81 MG tablet Take 81 mg by mouth daily.  . calcium carbonate (TUMS - DOSED IN MG ELEMENTAL CALCIUM) 500 MG chewable tablet Chew 2 tablets by mouth at bedtime.  . Cholecalciferol 125 MCG (5000 UT) capsule Take 5,000 Units by mouth daily.  . Cyanocobalamin (VITAMIN B-12) 5000 MCG TBDP Take 5,000 mcg by mouth daily.  . metoprolol succinate (TOPROL-XL) 25 MG 24 hr tablet Take 1 tablet (25 mg total) by mouth daily.  . metoprolol tartrate (  LOPRESSOR) 50 MG tablet TAKE 1 TABLET BY MOUTH TWO HOURS PRIOR TO CT  . Multiple Vitamins-Minerals (OCUVITE PO) Take by mouth daily at 6 (six) AM.  . olmesartan (BENICAR) 20 MG tablet Take 20 mg by mouth daily.  Marland Kitchen omeprazole (PRILOSEC OTC) 20 MG tablet Take 20 mg by mouth daily.  . rosuvastatin (CRESTOR) 20 MG tablet Take 1 tablet (20 mg total) by mouth daily. Please make annual appt with Dr. Claiborne Billings in April for refills. Thank you   Current Facility-Administered Medications for the 12/30/19 encounter (Telemedicine) with Troy Sine, MD  Medication  . 0.9 %  sodium chloride infusion     Allergies:   Penicillins   Social History   Tobacco Use  . Smoking status: Former Smoker    Types: Cigarettes    Quit date: 12/01/1978    Years since quitting: 41.1  . Smokeless tobacco: Former Systems developer    Quit date: 12/02/1975  Substance Use Topics  . Alcohol use: Yes    Comment: ocassional wine  and beer  . Drug use: No    Additional social history is notable that he is married for 57 years. He has 2 children, 5 grandchildren, and 4 great-grandchildren. He completed ninth grade but received his GED degree. He had worked for the  [employer]. He is retired.  Family Hx: The patient's family history includes CAD in his mother; CVA in his mother and another family member; Diabetes in his brother, brother, father, mother, sister, and another family member; Fibromyalgia in his daughter; Heart attack in his brother and brother; Heart disease in his brother, brother, brother, brother, and mother; Hypertension in his mother and son; Stomach cancer in his paternal grandfather; Stroke in his maternal grandmother and paternal grandmother. There is no history of Colon cancer.  ROS:   Please see the history of present illness.    No fevers chills night sweats No loss of taste or smell No cough Breathing well No palpitations No chest pain No abdominal discomfort GERD No leg swelling No neurologic symptoms Sleeping well  All other systems reviewed and are negative.   Prior CV studies:   The following studies were reviewed today:  ------------------------------------------------------------------- ECHO Study Conclusions: 10/26/2018  - Left ventricle: There was mild concentric hypertrophy. Systolic   function was normal. The estimated ejection fraction was in the   range of 60% to 65%. Doppler parameters are consistent with   abnormal left ventricular relaxation (grade 1 diastolic   dysfunction). Doppler parameters are consistent with   indeterminate ventricular filling pressure. - Aortic valve: There was mild regurgitation. Regurgitation   pressure half-time: 518 ms. - Left atrium: The atrium was mildly dilated. - Right ventricle: The cavity size was normal. Wall thickness was   normal. Systolic function was normal. - Atrial septum: No defect or patent foramen ovale was identified   by color flow Doppler. - Tricuspid valve: There was trivial regurgitation. - Pulmonary arteries: PA peak pressure: 33 mm Hg (S).  CTA FINDINGS: 11/23/2018 Non-cardiac: See separate report from [facility].  No significant findings on limited lung and soft tissue windows.  Calcium score: Mild calcium noted in mid RCA and distal LM proximal LAD  Coronary Arteries: Right dominant with no anomalies  LM: Less than 50% mixed plaque in distal LM  LAD: Less than 30% mixed plaque in proximal LAD  D1: Normal  D2: Normal  Circumflex: Less than 30% soft plaque proximally  OM1: Normal small vessel  OM2: Normal large vessel  RCA: Less than 30% mixed plaque in mid vessel  PDA: Normal  PLA: Normal  IMPRESSION: 1. Calcium score only 12 which is 8 th percentile for age and sex  2. Normal ascending aortic root 3.5 cm  3. Non obstructive CAD see description above Study not suitable for FFR CT due to marked misregistration artifact  Labs/Other Tests and Data Reviewed:    EKG:  An ECG dated 12/28/2018 was personally reviewed today and demonstrated:  Normal sinus rhythm at 99 bpm, right bundle branch block with repolarization changes, left anterior hemiblock; PR interval 176 ms, QTc interval 492 ms.  Recent Labs: 05/03/2019: ALT 31; BUN 22; Creatinine, Ser 1.02; Potassium 5.0; Sodium 139   Recent Lipid Panel Lab Results  Component Value Date/Time   CHOL 102 05/03/2019 09:37 AM   TRIG 66 05/03/2019 09:37 AM   HDL 40 05/03/2019 09:37 AM   CHOLHDL 2.6 05/03/2019 09:37 AM   LDLCALC 49 05/03/2019 09:37 AM    Wt Readings from Last 3 Encounters:  09/22/19 200 lb (90.7 kg)  03/30/19 200 lb (90.7 kg)  12/28/18 210 lb (95.3 kg)     Objective:    Vital Signs:  Ht 5\' 10"  (1.778 m)   BMI 28.70 kg/m    Since this was a telemedicine visit I could not physically examine the patient.  He was not at home and had forgotten to take his blood pressure this morning before leaving home.  Breathing was normal and not labored There was no audible wheezing His resting pulse he believes is in the 60s He denied any chest tightness or pressure.   He denied any abdominal discomfort.    His heart rhythm was regular by palpation.   There was no leg swelling.   He denied neurologic symptoms.   He had normal affect and mood   ASSESSMENT & PLAN:    1. Essential hypertension: He now is on olmesartan 20 mg in addition to Toprol-XL 25 mg daily.  He did not take his blood pressure today before he left home.  However he believes his blood pressures have been consistently stable on this therapy. 2. Hyperlipidemia: Significantly improved with institution of rosuvastatin with LDL cholesterol improving from 124 down to 49 and triglycerides 191 down to 102.  He will be undergoing repeat laboratory March 2021 with his primary physician 3. Grade 1 diastolic dysfunction.  Currently he is asymptomatic with therapy.  There are no palpitations.  He denies any significant exertional dyspnea. 4. Mild coronary atherosclerosis: Coronary CTA reviewed.  Plan is aggressive lipid therapy strategy and attempt to induce plaque regression. 5. 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  I could not obtain ECG today since this was a telemedicine evaluation. 6. GERD: Controlled with omeprazole.  COVID-19 Education: The signs and symptoms of COVID-19 were discussed with the patient and how to seek care for testing (follow up with PCP or arrange E-visit).  The importance of social distancing was discussed today.  Time:   Today, I have spent 16 minutes with the patient with telehealth technology discussing the above problems.     Medication Adjustments/Labs and Tests Ordered: Current medicines are reviewed at length with the patient today.  Concerns regarding medicines are outlined above.   Tests Ordered: No orders of the defined types were placed in this encounter.   Medication Changes: No orders of the defined types were placed in this encounter.   Follow Up: In person in 1  year  Signed, Shelva Majestic, MD  12/30/2019 9:15 AM     [place]

## 2019-12-30 NOTE — Patient Instructions (Signed)
Medication Instructions:  Your physician recommends that you continue on your current medications as directed. Please refer to the Current Medication list given to you today.  *If you need a refill on your cardiac medications before your next appointment, please call your pharmacy*   Follow-Up: At [facility], you and your health needs are our priority.  As part of our continuing mission to provide you with exceptional heart care, we have created designated Provider Care Teams.  These Care Teams include your primary Cardiologist (physician) and Advanced Practice Providers (APPs -  Physician Assistants and Nurse Practitioners) who all work together to provide you with the care you need, when you need it.  Your next appointment:   1 year(s)  The format for your next appointment:   In Person  Provider:   Shelva Majestic, MD  Other Instructions Please call two months ahead of time to schedule your annual follow-up.

## 2020-01-01 ENCOUNTER — Encounter: Payer: Self-pay | Admitting: Cardiovascular Disease

## 2020-01-02 ENCOUNTER — Telehealth: Payer: Self-pay | Admitting: Cardiovascular Disease

## 2020-01-02 NOTE — Telephone Encounter (Signed)
New Message  Pt was calling to give his BP readings and pulse and weight to Dr. Claiborne Billings

## 2020-01-02 NOTE — Telephone Encounter (Signed)
Pt called and stated that he had a virtual visit with Dr. Claiborne Billings on 1/29 and he couldn't get his vitals. He called in today and gave them. BP: 114/80 HR: 72 and Weight: 195 lbs. Will route to Dr. Claiborne Billings to make aware.

## 2020-01-04 NOTE — Telephone Encounter (Signed)
Vital signs are stable.  Continue treatment as discussed

## 2020-01-10 NOTE — Telephone Encounter (Signed)
Spoke with pt's wife. Notified of Dr.Kelly's advise that "vital signs are stable. Continue treatment as discussed." Wife agreeable and verbalized she would let her husband know. No other questions or concerns at this time.

## 2020-01-20 IMAGING — CT CT HEART MORP W/ CTA COR W/ SCORE W/ CA W/CM &/OR W/O CM
4 of 7 series · 8 of 20 positions shown, 9 images · IV contrast (APPLIED)
Comparison: None.

Addendum:
EXAM:
OVER-READ INTERPRETATION  CT CHEST

The following report is an over-read performed by radiologist Dr.
does not include interpretation of cardiac or coronary anatomy or
pathology. The cardiac/coronary CT interpretation by the
cardiologist is attached.
CLINICAL DATA: Chest pain
Cardiac CTA
MEDICATIONS:
Sub lingual nitro. 4 mg and lopressor 10mg
TECHNIQUE: The patient was scanned on a Siemens Force 192 scanner. Gantry
rotation speed was 250 msecs. Collimation was. 6 mm . A 120 kV
prospective scan was triggered in the ascending thoracic aorta at
140 HU's with full mA between 30-70% of the R-R interval . Average
HR during the scan was 72 bpm. The 3D data set was interpreted on a
dedicated work station using MPR, MIP and VRT modes. A total of 80
cc of contrast was used.

[Series 6: best diast 76 % · axial · 0.36mm/px · z∈[+1216,+1259]mm · 2 of 321 slices shown, 3 images]
[im 107/321  vessel]
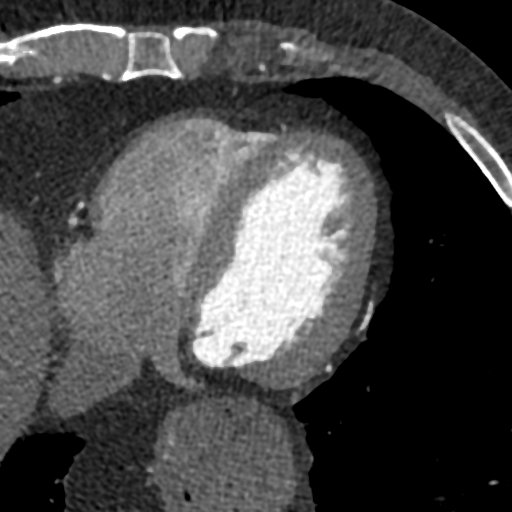
[im 107/321  lung]
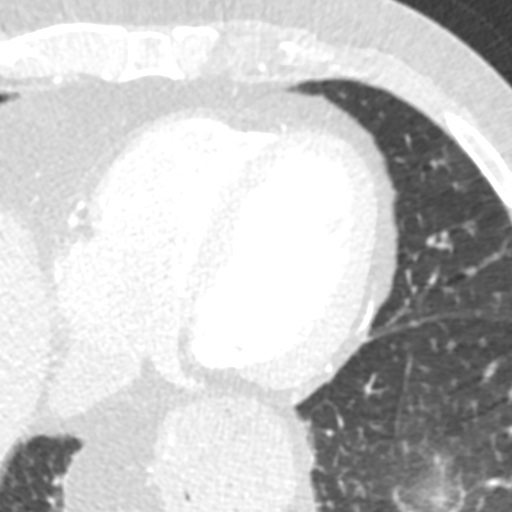
[im 214/321  vessel]
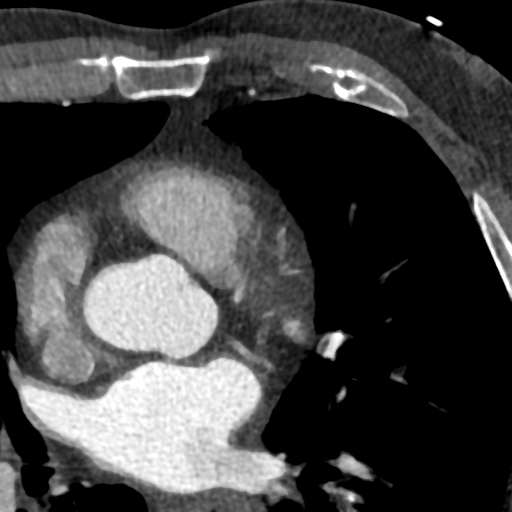

[Series 7: best syst 45 % · axial · 0.36mm/px · z∈[+1216,+1259]mm · 2 of 321 slices shown]
[im 107/321  vessel]
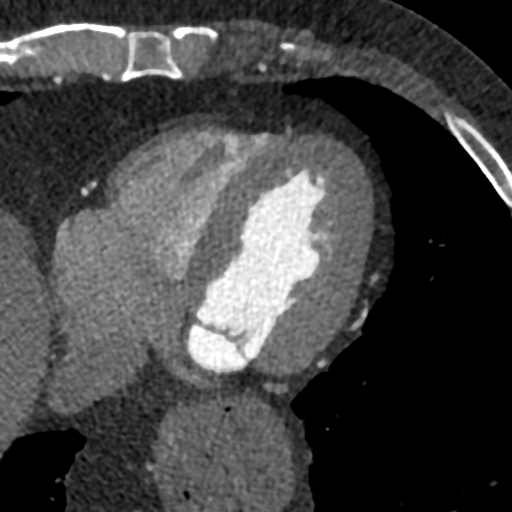
[im 214/321  vessel]
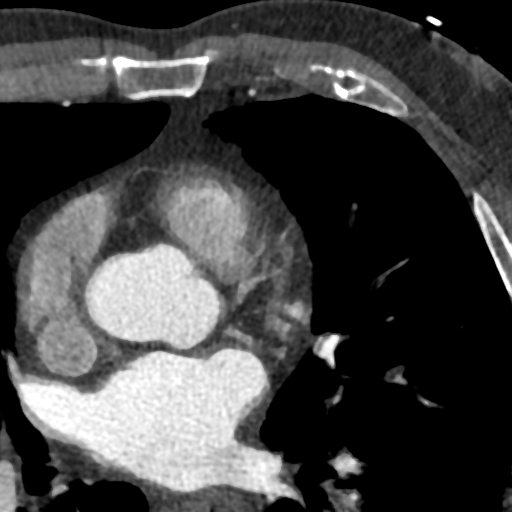

[Series 8: ts diast sharp 76 % · axial · 0.36mm/px · z∈[+1216,+1259]mm · 2 of 321 slices shown]
[im 107/321  lung]
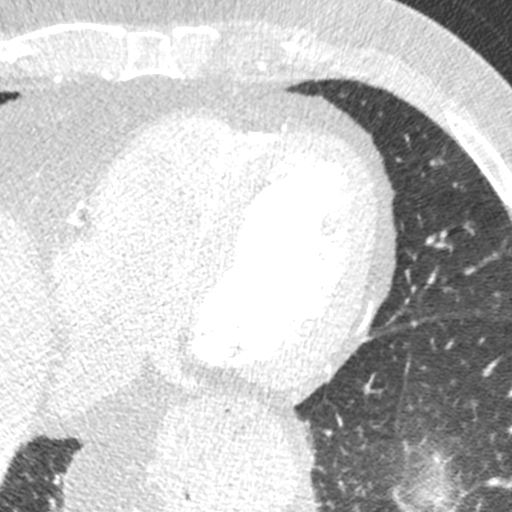
[im 214/321  lung]
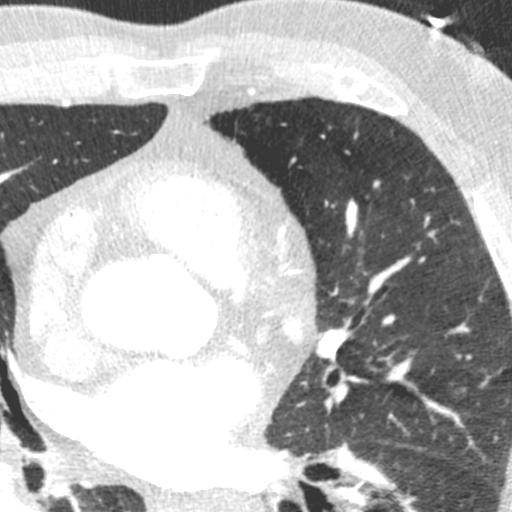

[Series 9: ts syst sharp 45 % · axial · 0.36mm/px · z∈[+1216,+1259]mm · 2 of 321 slices shown]
[im 107/321  lung]
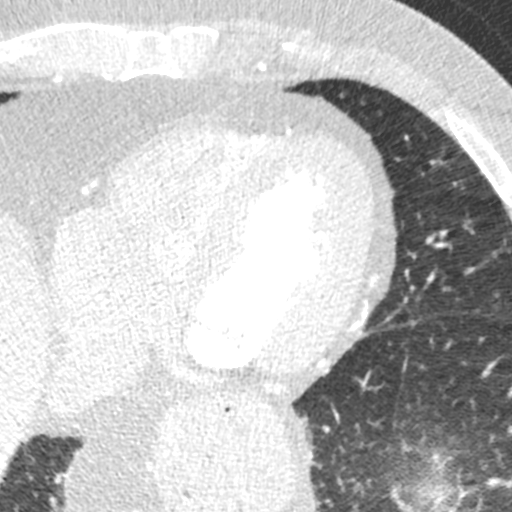
[im 214/321  lung]
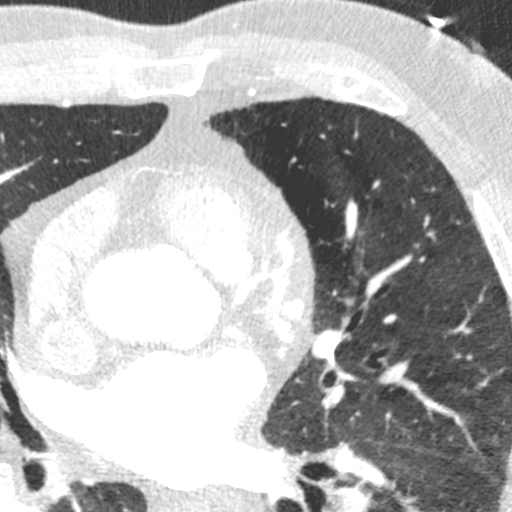

[8 of 20 positions shown; findings below may reference images not displayed]

FINDINGS: The visualized portions of the lower lung fields show no suspicious
nodules, masses, or infiltrates. A moderate size hiatal hernia is
noted.
IMPRESSION: Moderate size hiatal hernia.

No other significant non-cardiac abnormality in visualized portion
of the thorax.
FINDINGS: Non-cardiac: See separate report from [REDACTED]. No
significant findings on limited lung and soft tissue windows.

Calcium score: Mild calcium noted in mid RCA and distal LM proximal
LAD

Coronary Arteries: Right dominant with no anomalies

LM: Less than 50% mixed plaque in distal LM

LAD: Less than 30% mixed plaque in proximal LAD

D1: Normal

D2: Normal

Circumflex: Less than 30% soft plaque proximally

OM1: Normal small vessel

OM2: Normal large vessel

RCA: Less than 30% mixed plaque in mid vessel

PDA: Normal

PLA: Normal
IMPRESSION: 1.  Calcium score only 12 which is 8 th percentile for age and sex

2.  Normal ascending aortic root 3.5 cm

3. Non obstructive CAD see description above Study not suitable for
FFR CT due to marked misregistration artifact

Johnathan Boykin

*** End of Addendum ***

## 2020-03-11 ENCOUNTER — Other Ambulatory Visit: Payer: Self-pay | Admitting: Cardiovascular Disease

## 2020-03-25 ENCOUNTER — Other Ambulatory Visit: Payer: Self-pay | Admitting: Cardiovascular Disease

## 2021-01-25 ENCOUNTER — Other Ambulatory Visit: Payer: Self-pay | Admitting: Cardiovascular Disease

## 2021-04-08 ENCOUNTER — Other Ambulatory Visit: Payer: Self-pay | Admitting: Cardiovascular Disease

## 2021-07-29 ENCOUNTER — Encounter: Payer: Self-pay | Admitting: Cardiovascular Disease

## 2021-07-29 ENCOUNTER — Other Ambulatory Visit: Payer: Self-pay

## 2021-07-29 ENCOUNTER — Ambulatory Visit: Payer: Medicare Other | Admitting: Cardiovascular Disease

## 2021-07-29 VITALS — BP 132/68 | HR 74 | Ht 70.0 in | Wt 211.2 lb

## 2021-07-29 DIAGNOSIS — K219 Gastro-esophageal reflux disease without esophagitis: Secondary | ICD-10-CM

## 2021-07-29 DIAGNOSIS — I10 Essential (primary) hypertension: Secondary | ICD-10-CM | POA: Diagnosis not present

## 2021-07-29 DIAGNOSIS — I2510 Atherosclerotic heart disease of native coronary artery without angina pectoris: Secondary | ICD-10-CM | POA: Diagnosis not present

## 2021-07-29 DIAGNOSIS — I452 Bifascicular block: Secondary | ICD-10-CM

## 2021-07-29 DIAGNOSIS — E785 Hyperlipidemia, unspecified: Secondary | ICD-10-CM

## 2021-07-29 DIAGNOSIS — I2584 Coronary atherosclerosis due to calcified coronary lesion: Secondary | ICD-10-CM

## 2021-07-29 DIAGNOSIS — I5189 Other ill-defined heart diseases: Secondary | ICD-10-CM | POA: Diagnosis not present

## 2021-07-29 NOTE — Patient Instructions (Signed)
Medication Instructions:  Continue current medications  *If you need a refill on your cardiac medications before your next appointment, please call your pharmacy*   Lab Work: None Ordered   Testing/Procedures: None Ordered   Follow-Up: At [facility], you and your health needs are our priority.  As part of our continuing mission to provide you with exceptional heart care, we have created designated Provider Care Teams.  These Care Teams include your primary Cardiologist (physician) and Advanced Practice Providers (APPs -  Physician Assistants and Nurse Practitioners) who all work together to provide you with the care you need, when you need it.  We recommend signing up for the patient portal called "MyChart".  Sign up information is provided on this After Visit Summary.  MyChart is used to connect with patients for Virtual Visits (Telemedicine).  Patients are able to view lab/test results, encounter notes, upcoming appointments, etc.  Non-urgent messages can be sent to your provider as well.   To learn more about what you can do with MyChart, go to [email or web address].    Your next appointment:   1 year(s)  The format for your next appointment:   In Person  Provider:   You may see Bentley Fissel Kelly, MD or one of the following Advanced Practice Providers on your designated Care Team:   Hao Meng, PA-C Angela Duke, PA-C or  Krista Kroeger, PA-C     

## 2021-07-29 NOTE — Progress Notes (Signed)
Cardiology Office Note    Date:  08/04/2021   ID:  Eric, Luna August 15, 1941, MRN [id number]  PCP:  Eric Solian, MD  Cardiologist:  Eric Majestic, MD   No chief complaint on file.  F/U Cardiology evaluation  I care for [name].  History of Present Illness:  [name] is a 80 y.o. male who was self-referred for evaluation of exertional shortness of breath.  I saw him for initial evaluation in October 21, 2018 and last evaluated him in a telemedicine visit on December 30, 2019.  He presents for a 17-monthfollow-up evaluation  Mr. SGoynehas a history of hypertension and has been on olmesartan 20 mg daily.  He also has a history of GERD for which he takes omeprazole.  He states in the past he was on medication for hyperlipidemia and had taken simvastatin but he has not taken this in greater than 6 months.  He has a history of mild shortness of breath with activity.  More recently he has noticed perhaps slight increase with the shortness of breath particularly with uphill walking.  He hunts for his leisure and often notes shortness of breath with moving fast up hills.  He denies any definitive only associated chest heaviness.  He denies any PND, orthopnea, presyncope or syncope and denies any awareness of palpitations.  He has strong family history for heart disease that his mother had suffered a stroke and also had CABG revascularization surgery.  All 4 of his brothers have coronary artery disease of which 2 are deceased, one who had CABG revascularization surgery and another who had a MI.  His 2 living brothers have had coronary stents.  He has remote tobacco history and had smoked for approximately 15  years but quit approximately 40 years ago.   When I initially saw him, I recommended that he undergo a 2D echo Doppler study to evaluate systolic and diastolic function in addition to his valvular architecture.  With his significant CAD family history of also recommended  a coronary CT angiogram for further evaluation of exertional dyspnea to make certain it was not ischemia mediated.  His echo Doppler study was done on October 26, 2018 and showed an EF of 60 to 65%.  He had grade 1 diastolic dysfunction.  There was mild aortic regurgitation.  PA pressure was mildly increased at 33 mm.  Coronary CTA yielded a calcium score of 12 which is the 8th percentile for age and sex.  He was noted to have mild coronary plaque that was nonobstructive with less than 50% mixed plaque in the distal left main, less than 30% mixed plaque in the proximal LAD, less than 30% soft plaque in the proximal circumflex and less than 30% mixed plaque in the mid RCA.  Lipid studies were notable for an LDL cholesterol of 124.  He has not been on any lipid-lowering therapy.  His resting pulse typically is elevated mild shortness of breath with walking.   When I saw him in January 2020 in light of evidence for mild coronary plaque I discussed the importance of aggressive lipid intervention to potentially induce plaque regression. I initiated therapy with rosuvastatin 20 mg.  In addition, his resting pulse was 99.  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  I recommended the addition of low-dose metoprolol succinate at 25 mg which would help his to lower his resting heart rate and attenuate his submaximal  heart rate with activity.   I saw him in a telemedicine evaluation in April 2020.  At that time he continued to feel well.  Over the past several months, he has felt well.  He has been taking the rosuvastatin but he was concerned perhaps that the 20 mg dose was attributing to some of his reflux.  As result he self reduced it to 10 mg for several weeks.  Apparently he had spoken to a nurse recently and for the past week has been back on the 20 mg regimen.  He has noticed his resting pulse is improved with the addition of low-dose metoprolol.  His  shortness of breath is slightly improved.  He denied chest pain,  PND, orthopnea or palpitations.   I last evaluated him in a telemedicine encounter on December 30, 2019 and over the prior 9 months he continued to be stable.  His pulse has been consistently in the 60s to low 70s.  His blood pressure has been stable.  He had repeat laboratory with Dr. Elsworth Soho.  LDL cholesterol had improved from 124 down to 49 and triglycerides from 191 down to 102 on rosuvastatin.  He was to have repeat laboratory with Dr. Dagmar Hait in March 2021.  He continued to be active.  He denied any significant weight gain.    Since I last saw him he has continued to feel well.  He specifically denies any chest pain or shortness of breath.  There are no palpitations.  He does occasionally have GERD symptoms.  He enjoys fishing and hunting.  Dr. Dagmar Hait will be rechecking laboratory.  LDL cholesterol in August 2021 was 58 on rosuvastatin 20 mg.  He has continued to be on metoprolol succinate 25 mg daily and olmesartan 20 mg.  He presents for reevaluation.   Past Medical History:  Diagnosis Date   Allergy    Appendicitis    Cholecystitis    Cholelithiasis    Diverticulosis    Gallstones    GERD (gastroesophageal reflux disease)    H pylori ulcer    Hemorrhoids    Hiatal hernia    Hyperlipidemia    Hypertension    Kidney stones    Peptic ulcer    history of    Past Surgical History:  Procedure Laterality Date   APPENDECTOMY     CHOLECYSTECTOMY     COLONOSCOPY     INGUINAL HERNIA REPAIR     left   KIDNEY STONE SURGERY     lithotripsy and removal of kidney stone   UMBILICAL HERNIA REPAIR      Current Medications: Outpatient Medications Prior to Visit  Medication Sig Dispense Refill   Ascorbic Acid (VITAMIN C PO) Take 500 mg by mouth daily.      aspirin EC 81 MG tablet Take 81 mg by mouth daily.     calcium carbonate (TUMS - DOSED IN MG ELEMENTAL CALCIUM) 500 MG chewable tablet Chew 2 tablets by mouth at bedtime.      Cholecalciferol 125 MCG (5000 UT) capsule Take 5,000 Units by mouth daily.     Cyanocobalamin (VITAMIN B-12) 5000 MCG TBDP Take 5,000 mcg by mouth daily.     metoprolol succinate (TOPROL-XL) 25 MG 24 hr tablet TAKE 1 TABLET(25 MG) BY MOUTH DAILY 90 tablet 2   metoprolol tartrate (LOPRESSOR) 50 MG tablet TAKE 1 TABLET BY MOUTH TWO HOURS PRIOR TO CT 90 tablet 0   Multiple Vitamins-Minerals (OCUVITE PO) Take by mouth daily at 6 (six) AM.  olmesartan (BENICAR) 20 MG tablet Take 20 mg by mouth daily.     omeprazole (PRILOSEC OTC) 20 MG tablet Take 20 mg by mouth daily.     rosuvastatin (CRESTOR) 20 MG tablet Take 1 tablet (20 mg total) by mouth daily. NEEDS APPOINTMENT FOR FUTURE REFILLS. 90 tablet 1   Facility-Administered Medications Prior to Visit  Medication Dose Route Frequency Provider Last Rate Last Admin   0.9 %  sodium chloride infusion  500 mL Intravenous Once Irene Shipper, MD         Allergies:   Penicillins   Social History   Socioeconomic History   Marital status: Married    Spouse name: Not on file   Number of children: 2   Years of education: Not on file   Highest education level: Not on file  Occupational History   Occupation: retired  Tobacco Use   Smoking status: Former    Types: Cigarettes    Quit date: 12/01/1978    Years since quitting: 42.7   Smokeless tobacco: Former    Quit date: 12/02/1975  Vaping Use   Vaping Use: Never used  Substance and Sexual Activity   Alcohol use: Yes    Comment: ocassional wine  and beer   Drug use: No   Sexual activity: Not on file  Other Topics Concern   Not on file  Social History Narrative   Not on file   Social Determinants of Health   Financial Resource Strain: Not on file  Food Insecurity: Not on file  Transportation Needs: Not on file  Physical Activity: Not on file  Stress: Not on file  Social Connections: Not on file     Additional social history is notable that he is married for 60 years.  He has 2  children, 5 grandchildren, and 4 great-grandchildren.  He completed ninth grade but received his GED degree.  He had worked for the [employer].  He is retired.  Family History:  The patient's family history includes CAD in his mother; CVA in his mother and another family member; Diabetes in his brother, brother, father, mother, sister, and another family member; Fibromyalgia in his daughter; Heart attack in his brother and brother; Heart disease in his brother, brother, brother, brother, and mother; Hypertension in his mother and son; Stomach cancer in his paternal grandfather; Stroke in his maternal grandmother and paternal grandmother.   ROS General: Negative; No fevers, chills, or night sweats;  HEENT: Negative; No changes in vision or hearing, sinus congestion, difficulty swallowing Pulmonary: Negative; No cough, wheezing, hemoptysis Cardiovascular: See HPI GI: Negative; No nausea, vomiting, diarrhea, or abdominal pain GU: Negative; No dysuria, hematuria, or difficulty voiding Musculoskeletal: Negative; no myalgias, joint pain, or weakness Hematologic/Oncology: Negative; no easy bruising, bleeding Endocrine: Negative; no heat/cold intolerance; no diabetes Neuro: Negative; no changes in balance, headaches Skin: Negative; No rashes or skin lesions Psychiatric: Negative; No behavioral problems, depression Sleep: Negative; No snoring, daytime sleepiness, hypersomnolence, bruxism, restless legs, hypnogognic hallucinations, no cataplexy Other comprehensive 14 point system review is negative.   PHYSICAL EXAM:   VS:  BP 132/68   Pulse 74   Ht _0  (1.778 m)   Wt 211 lb 3.2 oz (95.8 kg)   SpO2 96%   BMI 30.30 kg/m     Repeat blood pressure by me 118/68  Wt Readings from Last 3 Encounters:  07/29/21 211 lb 3.2 oz (95.8 kg)  09/22/19 200 lb (90.7 kg)  03/30/19 200 lb (90.7 kg)  General: Alert, oriented, no distress.  Skin: normal turgor, no rashes, warm and dry HEENT:  Normocephalic, atraumatic. Pupils equal round and reactive to light; sclera anicteric; extraocular muscles intact;  Nose without nasal septal hypertrophy Mouth/Parynx benign; Mallinpatti scale 3 Neck: No JVD, no carotid bruits; normal carotid upstroke Lungs: clear to ausculatation and percussion; no wheezing or rales Chest wall: without tenderness to palpitation Heart: PMI not displaced, RRR, s1 s2 normal, 1/6 systolic murmur, no diastolic murmur, no rubs, gallops, thrills, or heaves Abdomen: soft, nontender; no hepatosplenomehaly, BS+; abdominal aorta nontender and not dilated by palpation. Back: no CVA tenderness Pulses 2+ Musculoskeletal: full range of motion, normal strength, no joint deformities Extremities: no clubbing cyanosis or edema, Homan's sign negative  Neurologic: grossly nonfocal; Cranial nerves grossly wnl Psychologic: Normal mood and affect   Studies/Labs Reviewed:   July 29, 2021 EKG:  EKG is ordered today.  ECG (independently read by me):  NSR at 74; Bifascular block with RBBB, LAHB  January 2020 ECG (independently read by me): Normal sinus rhythm at 99 bpm.  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  PR interval 176 ms; QTc interval 492 ms  October 21, 2018 ECG (independently read by me): Sinus rhythm at 95 bpm.  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  No ectopy.  Recent Labs: BMP Latest Ref Rng & Units 05/03/2019 11/16/2018 09/18/2010  Glucose 65 - 99 mg/dL 87 84 85  BUN 8 - 27 mg/dL _0 Creatinine 0.76 - 1.27 mg/dL 1.02 0.89 0.99  BUN/Creat Ratio 10 - _1 -  Sodium 134 - 144 mmol/L 139 140 140  Potassium 3.5 - 5.2 mmol/L 5.0 5.0 4.4  Chloride 96 - 106 mmol/L 100 101 108  CO2 20 - 29 mmol/L _2 Calcium 8.6 - 10.2 mg/dL 9.5 9.6 9.1     Hepatic Function Latest Ref Rng & Units 05/03/2019 11/16/2018 08/06/2006  Total Protein 6.0 - 8.5 g/dL 6.4 7.0 6.9  Albumin 3.7 - 4.7 g/dL 4.3 4.5 4.2  AST  0 - 40 IU/L _3 ALT 0 - 44 IU/L _4 Alk Phosphatase 39 - 117 IU/L 73 90 86  Total Bilirubin 0.0 - 1.2 mg/dL 0.8 0.8 0.8    CBC Latest Ref Rng & Units 11/16/2018 09/18/2010 12/14/2009  WBC 3.4 - 10.8 x10E3/uL 8.7 7.6 -  Hemoglobin 13.0 - 17.7 g/dL 14.8 15.2 15.8  Hematocrit 37.5 - 51.0 % 42.7 44.1 -  Platelets 150 - 450 x10E3/uL 148(L) 230 -   Lab Results  Component Value Date   MCV 88 11/16/2018   MCV 88.0 09/18/2010   MCV 89.2 09/11/2006   Lab Results  Component Value Date   TSH 2.740 11/16/2018   No results found for: HGBA1C   BNP No results found for: BNP  ProBNP No results found for: PROBNP   Lipid Panel     Component Value Date/Time   CHOL 102 05/03/2019 0937   TRIG 66 05/03/2019 0937   HDL 40 05/03/2019 0937   CHOLHDL 2.6 05/03/2019 0937   LDLCALC 49 05/03/2019 0937     RADIOLOGY: No results found.   Additional studies/ records that were reviewed today include:   I reviewed laboratory from [place] from June 30, 2018.  Total cholesterol 164, HDL 46, LDL 104, triglycerides 69. Hemoglobin 14.5.  Creatinine 0.9.  ALT 18.  TSH 1.1  I have reviewed laboratory from Dr. Myna Hidalgo.  ASSESSMENT:    1. Essential hypertension   2. Coronary artery calcification   3. Hyperlipidemia with target LDL less than 70   4. Grade I diastolic dysfunction   5. Bifascicular block (right bundle branch block/LAFB)   6. Gastroesophageal reflux disease without esophagitis    PLAN:  Mr. Mahmoud Blazejewski is a 80 year old gentleman who has a several year history of hypertension.   He also has a history of mild hyperlipidemia and remotely had been on simvastatin and presently is on rosuvastatin 20 mg daily.  He has a history of GERD for which he takes omeprazole.  When I initially evaluated him he had exertional dyspnea typically with walking uphill and improved with rest.  He has been documented to have hyperdynamic LV function with grade 1 diastolic dysfunction on  echocardiography.  PA pressure was minimally increased at 33 mm.  There was mild aortic regurgitation and mild left atrial dilatation.  Coronary CTA demonstrated minimal coronary calcification with a calcium score of 8.  There was mild calcification in the mid RCA, distal left main and proximal LAD.  Subsequent to his CTA, he was started on rosuvastatin after having been off simvastatin for some time.  Prior to initiation of rosuvastatin, his LDL cholesterol has risen to 124 most recent LDL cholesterol in August 2021 showed LDL cholesterol at 58.  At this level, hopefully there will be plaque stability with potential regression.  He remains asymptomatic with reference to chest pain.  In addition, metoprolol succinate 25 mg was added to his olmesartan regimen both for blood pressure control as well as his CAD.  His shortness of breath essentially has resolved.  He is on metoprolol succinate 25 mg and resting pulse is stable and he is not having palpitations.  His GERD is controlled.  He will continue current therapy.  I will see him in 1 year for reevaluation or sooner as needed.  Medication Adjustments/Labs and Tests Ordered: Current medicines are reviewed at length with the patient today.  Concerns regarding medicines are outlined above.  Medication changes, Labs and Tests ordered today are listed in the Patient Instructions below. Patient Instructions  Medication Instructions:  Continue current medications  *If you need a refill on your cardiac medications before your next appointment, please call your pharmacy*   Lab Work: None Ordered   Testing/Procedures: None Ordered   Follow-Up: At [facility], you and your health needs are our priority.  As part of our continuing mission to provide you with exceptional heart care, we have created designated Provider Care Teams.  These Care Teams include your primary Cardiologist (physician) and Advanced Practice Providers (APPs -  Physician Assistants  and Nurse Practitioners) who all work together to provide you with the care you need, when you need it.  We recommend signing up for the patient portal called "MyChart".  Sign up information is provided on this After Visit Summary.  MyChart is used to connect with patients for Virtual Visits (Telemedicine).  Patients are able to view lab/test results, encounter notes, upcoming appointments, etc.  Non-urgent messages can be sent to your provider as well.   To learn more about what you can do with MyChart, go to [email or web address].    Your next appointment:   1 year(s)  The format for your next appointment:   In Person  Provider:   You may see Eric Majestic, MD or one of the following Advanced Practice Providers on your designated Care Team:   Almyra Deforest, PA-C Fabian Sharp,  PA-C or  Roby Lofts, PA-C     Signed, Eric Majestic, MD  08/04/2021 6:04 PM    [facility] [address] [place], [place],   [address] Phone: [phone]

## 2021-08-04 ENCOUNTER — Encounter: Payer: Self-pay | Admitting: Cardiovascular Disease

## 2021-10-10 ENCOUNTER — Other Ambulatory Visit: Payer: Self-pay | Admitting: Cardiovascular Disease

## 2021-10-21 ENCOUNTER — Other Ambulatory Visit: Payer: Self-pay | Admitting: Cardiovascular Disease

## 2021-11-08 ENCOUNTER — Telehealth: Payer: Self-pay | Admitting: Cardiovascular Disease

## 2021-11-08 MED ORDER — METOPROLOL SUCCINATE ER 25 MG PO TB24: ORAL_TABLET | ORAL | 2 refills | Status: DC

## 2021-11-08 NOTE — Telephone Encounter (Signed)
Discussed woth Ms Fries..refill sent

## 2021-11-08 NOTE — Telephone Encounter (Signed)
*  STAT* If patient is at the pharmacy, call can be transferred to refill team.   1. Which medications need to be refilled? (please list name of each medication and dose if known) metoprolol tartrate (LOPRESSOR) 50 MG tablet  2. Which pharmacy/location (including street and city if local pharmacy) is medication to be sent to?[place], [place] AT [place]  3. Do they need a 30 day or 90 day supply? 90 day  Patient is out of medication.

## 2022-08-06 ENCOUNTER — Other Ambulatory Visit: Payer: Self-pay | Admitting: Cardiovascular Disease

## 2022-08-20 ENCOUNTER — Other Ambulatory Visit: Payer: Self-pay | Admitting: Cardiovascular Disease

## 2022-10-01 ENCOUNTER — Ambulatory Visit: Payer: Medicare Other | Attending: Cardiovascular Disease | Admitting: Cardiovascular Disease

## 2022-10-01 ENCOUNTER — Encounter: Payer: Self-pay | Admitting: Cardiovascular Disease

## 2022-10-01 VITALS — BP 130/78 | HR 71 | Ht 70.0 in | Wt 207.0 lb

## 2022-10-01 DIAGNOSIS — E785 Hyperlipidemia, unspecified: Secondary | ICD-10-CM | POA: Diagnosis not present

## 2022-10-01 DIAGNOSIS — I2510 Atherosclerotic heart disease of native coronary artery without angina pectoris: Secondary | ICD-10-CM

## 2022-10-01 DIAGNOSIS — I10 Essential (primary) hypertension: Secondary | ICD-10-CM

## 2022-10-01 DIAGNOSIS — I452 Bifascicular block: Secondary | ICD-10-CM

## 2022-10-01 DIAGNOSIS — K219 Gastro-esophageal reflux disease without esophagitis: Secondary | ICD-10-CM

## 2022-10-01 DIAGNOSIS — I2584 Coronary atherosclerosis due to calcified coronary lesion: Secondary | ICD-10-CM

## 2022-10-01 DIAGNOSIS — I5189 Other ill-defined heart diseases: Secondary | ICD-10-CM

## 2022-10-01 NOTE — Progress Notes (Signed)
Cardiology Office Note    Date:  10/03/2022   ID:  [name], [name] 31-May-1941, MRN [id number]  PCP:  Eric Solian, MD  Cardiologist:  Eric Majestic, MD   No chief complaint on file.  14 month F/U Cardiology evaluation  I care for [name].  History of Present Illness:  [name] is a 81 y.o. male who was initially self-referred for evaluation of exertional shortness of breath.  I saw him for initial evaluation in October 21, 2018 and last evaluated him on July 29, 2021. Marland Kitchen  He presents for a 14 month follow-up evaluation  [name] has a history of hypertension and has been on olmesartan 20 mg daily.  He also has a history of GERD for which he takes omeprazole.  He states in the past he was on medication for hyperlipidemia and had taken simvastatin but he has not taken this in greater than 6 months.  He has a history of mild shortness of breath with activity.  More recently he has noticed perhaps slight increase with the shortness of breath particularly with uphill walking.  He hunts for his leisure and often notes shortness of breath with moving fast up hills.  He denies any definitive only associated chest heaviness.  He denies any PND, orthopnea, presyncope or syncope and denies any awareness of palpitations.  He has strong family history for heart disease that his mother had suffered a stroke and also had CABG revascularization surgery.  All 4 of his brothers have coronary artery disease of which 2 are deceased, one who had CABG revascularization surgery and another who had a MI.  His 2 living brothers have had coronary stents.  He has remote tobacco history and had smoked for approximately 15  years but quit approximately 40 years ago.   When I initially saw him, I recommended that he undergo a 2D echo Doppler study to evaluate systolic and diastolic function in addition to his valvular architecture.  With his significant CAD family history of also recommended  a coronary CT angiogram for further evaluation of exertional dyspnea to make certain it was not ischemia mediated.  His echo Doppler study was done on October 26, 2018 and showed an EF of 60 to 65%.  He had grade 1 diastolic dysfunction.  There was mild aortic regurgitation.  PA pressure was mildly increased at 33 mm.  Coronary CTA yielded a calcium score of 12 which is the 8th percentile for age and sex.  He was noted to have mild coronary plaque that was nonobstructive with less than 50% mixed plaque in the distal left main, less than 30% mixed plaque in the proximal LAD, less than 30% soft plaque in the proximal circumflex and less than 30% mixed plaque in the mid RCA.  Lipid studies were notable for an LDL cholesterol of 124.  He has not been on any lipid-lowering therapy.  His resting pulse typically is elevated mild shortness of breath with walking.   When I saw him in January 2020 in light of evidence for mild coronary plaque I discussed the importance of aggressive lipid intervention to potentially induce plaque regression. I initiated therapy with rosuvastatin 20 mg.  In addition, his resting pulse was 99.  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  I recommended the addition of low-dose metoprolol succinate at 25 mg which would help his to lower his resting heart rate  and attenuate his submaximal heart rate with activity.   I saw him in a telemedicine evaluation in April 2020.  At that time he continued to feel well.  Over the past several months, he has felt well.  He has been taking the rosuvastatin but he was concerned perhaps that the 20 mg dose was attributing to some of his reflux.  As result he self reduced it to 10 mg for several weeks.  Apparently he had spoken to a nurse recently and for the past week has been back on the 20 mg regimen.  He has noticed his resting pulse is improved with the addition of low-dose metoprolol.  His  shortness of breath is slightly improved.  He denied chest pain,  PND, orthopnea or palpitations.   I evaluated him in a telemedicine encounter on December 30, 2019 and over the prior 9 months he continued to be stable.  His pulse has been consistently in the 60s to low 70s.  His blood pressure has been stable.  He had repeat laboratory with Dr. Elsworth Luna.  LDL cholesterol had improved from 124 down to 49 and triglycerides from 191 down to 102 on rosuvastatin.  He was to have repeat laboratory with Dr. Dagmar Luna in March 2021.  He continued to be active.  He denied any significant weight gain.    I last saw him on July 29, 2021 at which time he continued to feel well and specifically denied any chest pain, shortness of breath or palpitations.  He does occasionally have GERD symptoms.  He enjoys fishing and hunting.  Dr. Dagmar Luna will be rechecking laboratory.  LDL cholesterol in August 2021 was 58 on rosuvastatin 20 mg.  He has continued to be on metoprolol succinate 25 mg daily and olmesartan 20 mg.    Presently, he feels well.  He continues to walk daily and typically at least 4000-6000 steps.  There is no chest pain or change in exercise capacity.  He continues to be on metoprolol succinate 25 mg daily, olmesartan 20 mg for hypertension.  He is on rosuvastatin 20 mg for hyperlipidemia.  He continues to be on a baby aspirin and takes omeprazole for GERD.  He presents for follow-up evaluation.   Past Medical History:  Diagnosis Date   Allergy    Appendicitis    Cholecystitis    Cholelithiasis    Diverticulosis    Gallstones    GERD (gastroesophageal reflux disease)    H pylori ulcer    Hemorrhoids    Hiatal hernia    Hyperlipidemia    Hypertension    Kidney stones    Peptic ulcer    history of    Past Surgical History:  Procedure Laterality Date   APPENDECTOMY     CHOLECYSTECTOMY     COLONOSCOPY     INGUINAL HERNIA REPAIR     left   KIDNEY STONE SURGERY     lithotripsy and removal of kidney  stone   UMBILICAL HERNIA REPAIR      Current Medications: Outpatient Medications Prior to Visit  Medication Sig Dispense Refill   Ascorbic Acid (VITAMIN C PO) Take 500 mg by mouth daily.      aspirin EC 81 MG tablet Take 81 mg by mouth daily.     calcium carbonate (TUMS - DOSED IN MG ELEMENTAL CALCIUM) 500 MG chewable tablet Chew 2 tablets by mouth at bedtime.     Cholecalciferol 125 MCG (5000 UT) capsule Take 5,000 Units by mouth daily.  Cyanocobalamin (VITAMIN B-12) 5000 MCG TBDP Take 5,000 mcg by mouth daily.     metoprolol succinate (TOPROL-XL) 25 MG 24 hr tablet TAKE 1 TABLET(25 MG) BY MOUTH DAILY 30 tablet 0   Multiple Vitamins-Minerals (OCUVITE PO) Take by mouth daily at 6 (six) AM.     olmesartan (BENICAR) 20 MG tablet Take 20 mg by mouth daily.     omeprazole (PRILOSEC OTC) 20 MG tablet Take 20 mg by mouth daily.     rosuvastatin (CRESTOR) 20 MG tablet Take 1 tablet (20 mg total) by mouth daily. 90 tablet 3   Facility-Administered Medications Prior to Visit  Medication Dose Route Frequency Provider Last Rate Last Admin   0.9 %  sodium chloride infusion  500 mL Intravenous Once Irene Shipper, MD         Allergies:   Penicillins   Social History   Socioeconomic History   Marital status: Married    Spouse name: Not on file   Number of children: 2   Years of education: Not on file   Highest education level: Not on file  Occupational History   Occupation: retired  Tobacco Use   Smoking status: Former    Types: Cigarettes    Quit date: 12/01/1978    Years since quitting: 43.8   Smokeless tobacco: Former    Quit date: 12/02/1975  Vaping Use   Vaping Use: Never used  Substance and Sexual Activity   Alcohol use: Yes    Comment: ocassional wine  and beer   Drug use: No   Sexual activity: Not on file  Other Topics Concern   Not on file  Social History Narrative   Not on file   Social Determinants of Health   Financial Resource Strain: Not on file  Food  Insecurity: Not on file  Transportation Needs: Not on file  Physical Activity: Not on file  Stress: Not on file  Social Connections: Not on file     Additional social history is notable that he is married for 60 years.  He has 2 children, 5 grandchildren, and 4 great-grandchildren.  He completed ninth grade but received his GED degree.  He had worked for the [employer].  He is retired.  Family History:  The patient's family history includes CAD in his mother; CVA in his mother and another family member; Diabetes in his brother, brother, father, mother, sister, and another family member; Fibromyalgia in his daughter; Heart attack in his brother and brother; Heart disease in his brother, brother, brother, brother, and mother; Hypertension in his mother and son; Stomach cancer in his paternal grandfather; Stroke in his maternal grandmother and paternal grandmother.   ROS General: Negative; No fevers, chills, or night sweats;  HEENT: Negative; No changes in vision or hearing, sinus congestion, difficulty swallowing Pulmonary: Negative; No cough, wheezing, hemoptysis Cardiovascular: See HPI GI: Negative; No nausea, vomiting, diarrhea, or abdominal pain GU: Negative; No dysuria, hematuria, or difficulty voiding Musculoskeletal: Negative; no myalgias, joint pain, or weakness Hematologic/Oncology: Negative; no easy bruising, bleeding Endocrine: Negative; no heat/cold intolerance; no diabetes Neuro: Negative; no changes in balance, headaches Skin: Negative; No rashes or skin lesions Psychiatric: Negative; No behavioral problems, depression Sleep: Negative; No snoring, daytime sleepiness, hypersomnolence, bruxism, restless legs, hypnogognic hallucinations, no cataplexy Other comprehensive 14 point system review is negative.   PHYSICAL EXAM:   VS:  BP 130/78   Pulse 71   Ht _0  (1.778 m)   Wt 207 lb (93.9 kg)  SpO2 94%   BMI 29.70 kg/m     Repeat blood pressure by me was  excellent at 112/72  Wt Readings from Last 3 Encounters:  10/01/22 207 lb (93.9 kg)  07/29/21 211 lb 3.2 oz (95.8 kg)  09/22/19 200 lb (90.7 kg)    General: Alert, oriented, no distress.  Skin: normal turgor, no rashes, warm and dry HEENT: Normocephalic, atraumatic. Pupils equal round and reactive to light; sclera anicteric; extraocular muscles intact; hearing aid in right ear Nose without nasal septal hypertrophy Mouth/Parynx benign; Mallinpatti scale 3 Neck: No JVD, no carotid bruits; normal carotid upstroke Lungs: clear to ausculatation and percussion; no wheezing or rales Chest wall: without tenderness to palpitation Heart: PMI not displaced, RRR, s1 s2 normal, 1/6 systolic murmur, no diastolic murmur, no rubs, gallops, thrills, or heaves Abdomen: soft, nontender; no hepatosplenomehaly, BS+; abdominal aorta nontender and not dilated by palpation. Back: no CVA tenderness Pulses 2+ Musculoskeletal: full range of motion, normal strength, no joint deformities Extremities: no clubbing cyanosis or edema, Homan's sign negative  Neurologic: grossly nonfocal; Cranial nerves grossly wnl Psychologic: Normal mood and affect     Studies/Labs Reviewed:   October 01, 2022 ECG (independently read by me): NSR at 71, RBBB, LAHB  July 29, 2021 ECG (independently read by me):  NSR at 74; Bifascular block with RBBB, LAHB  January 2020 ECG (independently read by me): Normal sinus rhythm at 99 bpm.  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  PR interval 176 ms; QTc interval 492 ms  October 21, 2018 ECG (independently read by me): Sinus rhythm at 95 bpm.  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  No ectopy.  Recent Labs:    Latest Ref Rng & Units 05/03/2019    9:37 AM 11/16/2018    9:14 AM 09/18/2010   11:43 AM  BMP  Glucose 65 - 99 mg/dL 87  84  85   BUN 8 - 27 mg/dL _0 Creatinine 0.76 - 1.27 mg/dL 1.02  0.89  0.99   BUN/Creat  Ratio 10 - _1 Sodium 134 - 144 mmol/L 139  140  140   Potassium 3.5 - 5.2 mmol/L 5.0  5.0  4.4   Chloride 96 - 106 mmol/L 100  101  108   CO2 20 - 29 mmol/L _2 Calcium 8.6 - 10.2 mg/dL 9.5  9.6  9.1         Latest Ref Rng & Units 05/03/2019    9:37 AM 11/16/2018    9:14 AM 08/06/2006   11:54 AM  Hepatic Function  Total Protein 6.0 - 8.5 g/dL 6.4  7.0  6.9   Albumin 3.7 - 4.7 g/dL 4.3  4.5  4.2   AST 0 - 40 IU/L _3 ALT 0 - 44 IU/L _4 Alk Phosphatase 39 - 117 IU/L 73  90  86   Total Bilirubin 0.0 - 1.2 mg/dL 0.8  0.8  0.8        Latest Ref Rng & Units 11/16/2018    9:14 AM 09/18/2010   11:43 AM 12/14/2009    7:15 AM  CBC  WBC 3.4 - 10.8 x10E3/uL 8.7  7.6    Hemoglobin 13.0 - 17.7 g/dL 14.8  15.2  15.8   Hematocrit 37.5 - 51.0 % 42.7  44.1  Platelets 150 - 450 x10E3/uL 148  230     Lab Results  Component Value Date   MCV 88 11/16/2018   MCV 88.0 09/18/2010   MCV 89.2 09/11/2006   Lab Results  Component Value Date   TSH 2.740 11/16/2018   No results found for: "HGBA1C"   BNP No results found for: "BNP"  ProBNP No results found for: "PROBNP"   Lipid Panel     Component Value Date/Time   CHOL 102 05/03/2019 0937   TRIG 66 05/03/2019 0937   HDL 40 05/03/2019 0937   CHOLHDL 2.6 05/03/2019 0937   LDLCALC 49 05/03/2019 0937     RADIOLOGY: No results found.   Additional studies/ records that were reviewed today include:   I reviewed laboratory from [facility] from June 30, 2018.  Total cholesterol 164, HDL 46, LDL 104, triglycerides 69. Hemoglobin 14.5.  Creatinine 0.9.  ALT 18.  TSH 1.1  I have reviewed laboratory from Dr. Myna Hidalgo.   ASSESSMENT:    1. Essential hypertension   2. Coronary artery calcification   3. 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 Grade I diastolic dysfunction   6. Gastroesophageal reflux disease without esophagitis      PLAN:  [name] is an 81 year old gentleman who has a several year history of hypertension.   He also has a history of mild hyperlipidemia and remotely had been on simvastatin and presently is on rosuvastatin 20 mg daily.  He has a history of GERD for which he takes omeprazole.  When I initially evaluated him he had exertional dyspnea typically with walking uphill and improved with rest.  He has been documented to have hyperdynamic LV function with grade 1 diastolic dysfunction on echocardiography.  PA pressure was minimally increased at 33 mm.  There was mild aortic regurgitation and mild left atrial dilatation.  Coronary CTA demonstrated minimal coronary calcification with a calcium score of 8.  There was mild calcification in the mid RCA, distal left main and proximal LAD.  Subsequent to his CTA, he was started on rosuvastatin after having been off simvastatin for some time.  Prior to initiation of rosuvastatin, his LDL cholesterol has risen to 124 and had reduced to 58 in August 2021.  At his most recent evaluation in September 2022 LDL is now further suppressed at 36 with total cholesterol 84.  He remains chest pain-free.  Renal function is stable on current therapy with creatinine at 0.8 in 2022.  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  He will be seeing Dr. Dagmar Luna for follow-up laboratory.  I have recommended he continue current therapy and as long as he remains stable we will see him in 1 year for reevaluation.  Medication Adjustments/Labs and Tests Ordered: Current medicines are reviewed at length with the patient today.  Concerns regarding medicines are outlined above.  Medication changes, Labs and Tests ordered today are listed in the Patient Instructions below. Patient Instructions  Medication Instructions:  Continue same medications  Lab Work: None ordered     Testing/Procedures: None ordered   Follow-Up: At [facility], you and your health needs are our priority.  As part of our continuing mission to provide you with exceptional heart care, we have created designated Provider Care Teams.  These Care Teams include your primary Cardiologist (physician) and Advanced  Practice Providers (APPs -  Physician Assistants and Nurse Practitioners) who all work together to provide you with the care you need, when you need it.  We recommend signing up for the patient portal called "MyChart".  Sign up information is provided on this After Visit Summary.  MyChart is used to connect with patients for Virtual Visits (Telemedicine).  Patients are able to view lab/test results, encounter notes, upcoming appointments, etc.  Non-urgent messages can be sent to your provider as well.   To learn more about what you can do with MyChart, go to [email or web address].    Your next appointment:  1 year   Call in July to schedule Nov appointment     The format for your next appointment: Office   Provider:  [facility]   Important Information About Sugar         Signed, Eric Majestic, MD  10/03/2022 11:55 AM    [facility] [address] [place], [place], [place]  [address] Phone: [phone]

## 2022-10-01 NOTE — Patient Instructions (Addendum)
Medication Instructions:  Continue same medications  Lab Work: None ordered    Testing/Procedures: None ordered   Follow-Up: At [facility], you and your health needs are our priority.  As part of our continuing mission to provide you with exceptional heart care, we have created designated Provider Care Teams.  These Care Teams include your primary Cardiologist (physician) and Advanced Practice Providers (APPs -  Physician Assistants and Nurse Practitioners) who all work together to provide you with the care you need, when you need it.  We recommend signing up for the patient portal called "MyChart".  Sign up information is provided on this After Visit Summary.  MyChart is used to connect with patients for Virtual Visits (Telemedicine).  Patients are able to view lab/test results, encounter notes, upcoming appointments, etc.  Non-urgent messages can be sent to your provider as well.   To learn more about what you can do with MyChart, go to [email or web address].    Your next appointment:  1 year   Call in July to schedule Nov appointment     The format for your next appointment: Office   Provider:  [facility]   Important Information About Sugar

## 2022-10-03 ENCOUNTER — Encounter: Payer: Self-pay | Admitting: Cardiovascular Disease

## 2022-11-18 ENCOUNTER — Other Ambulatory Visit: Payer: Self-pay | Admitting: Cardiovascular Disease

## 2022-12-29 ENCOUNTER — Other Ambulatory Visit: Payer: Self-pay | Admitting: Cardiovascular Disease

## 2023-02-12 ENCOUNTER — Other Ambulatory Visit: Payer: Self-pay | Admitting: Nurse Practitioner

## 2023-08-19 ENCOUNTER — Other Ambulatory Visit: Payer: Self-pay | Admitting: Nurse Practitioner

## 2023-10-22 ENCOUNTER — Encounter: Payer: Self-pay | Admitting: Cardiovascular Disease

## 2023-10-22 ENCOUNTER — Ambulatory Visit: Payer: Medicare Other | Attending: Cardiovascular Disease | Admitting: Cardiovascular Disease

## 2023-10-22 VITALS — BP 120/88 | HR 85 | Ht 70.0 in | Wt 210.8 lb

## 2023-10-22 DIAGNOSIS — E785 Hyperlipidemia, unspecified: Secondary | ICD-10-CM

## 2023-10-22 DIAGNOSIS — I452 Bifascicular block: Secondary | ICD-10-CM

## 2023-10-22 DIAGNOSIS — K219 Gastro-esophageal reflux disease without esophagitis: Secondary | ICD-10-CM

## 2023-10-22 DIAGNOSIS — I2510 Atherosclerotic heart disease of native coronary artery without angina pectoris: Secondary | ICD-10-CM | POA: Diagnosis not present

## 2023-10-22 DIAGNOSIS — I10 Essential (primary) hypertension: Secondary | ICD-10-CM

## 2023-10-22 DIAGNOSIS — I5189 Other ill-defined heart diseases: Secondary | ICD-10-CM

## 2023-10-22 NOTE — Patient Instructions (Addendum)
Medication Instructions:  No medication changes were made during today's visit  *If you need a refill on your cardiac medications before your next appointment, please call your pharmacy*   Lab Work: No labs were order during today's visit.  If you have labs (blood work) drawn today and your tests are completely normal, you will receive your results only by: MyChart Message (if you have MyChart) OR A paper copy in the mail If you have any lab test that is abnormal or we need to change your treatment, we will call you to review the results.   Testing/Procedures: No procedures ordered today.    Follow-Up: At [facility], you and your health needs are our priority.  As part of our continuing mission to provide you with exceptional heart care, we have created designated Provider Care Teams.  These Care Teams include your primary Cardiologist (physician) and Advanced Practice Providers (APPs -  Physician Assistants and Nurse Practitioners) who all work together to provide you with the care you need, when you need it.  We recommend signing up for the patient portal called "MyChart".  Sign up information is provided on this After Visit Summary.  MyChart is used to connect with patients for Virtual Visits (Telemedicine).  Patients are able to view lab/test results, encounter notes, upcoming appointments, etc.  Non-urgent messages can be sent to your provider as well.   To learn more about what you can do with MyChart, go to [email or web address].    Your next appointment:   1 year(s)  Provider:   Dr. Epifanio Lesches

## 2023-10-22 NOTE — Progress Notes (Signed)
Cardiology Office Note    Date:  11/01/2023   ID:  [name], [name] 1941-07-03, MRN [id number]  PCP:  Chilton Greathouse, MD  Cardiologist:  Nicki Guadalajara, MD   12 month F/U Cardiology evaluation  I care for [name].  History of Present Illness:  [name] is a 82 y.o. male who was initially self-referred for evaluation of exertional shortness of breath.  I saw him for initial evaluation in October 21, 2018 and last evaluated him on October 01, 2022.  He presents for a 1 year follow-up evaluation  [name] has a history of hypertension and has been on olmesartan 20 mg daily.  He also has a history of GERD for which he takes omeprazole.  He states in the past he was on medication for hyperlipidemia and had taken simvastatin but he has not taken this in greater than 6 months.  He has a history of mild shortness of breath with activity.  More recently he has noticed perhaps slight increase with the shortness of breath particularly with uphill walking.  He hunts for his leisure and often notes shortness of breath with moving fast up hills.  He denies any definitive only associated chest heaviness.  He denies any PND, orthopnea, presyncope or syncope and denies any awareness of palpitations.  He has strong family history for heart disease that his mother had suffered a stroke and also had CABG revascularization surgery.  All 4 of his brothers have coronary artery disease of which 2 are deceased, one who had CABG revascularization surgery and another who had a MI.  His 2 living brothers have had coronary stents.  He has remote tobacco history and had smoked for approximately 15  years but quit approximately 40 years ago.   When I initially saw him, I recommended that he undergo a 2D echo Doppler study to evaluate systolic and diastolic function in addition to his valvular architecture.  With his significant CAD family history of also recommended a coronary CT angiogram for  further evaluation of exertional dyspnea to make certain it was not ischemia mediated.  His echo Doppler study was done on October 26, 2018 and showed an EF of 60 to 65%.  He had grade 1 diastolic dysfunction.  There was mild aortic regurgitation.  PA pressure was mildly increased at 33 mm.  Coronary CTA yielded a calcium score of 12 which is the 8th percentile for age and sex.  He was noted to have mild coronary plaque that was nonobstructive with less than 50% mixed plaque in the distal left main, less than 30% mixed plaque in the proximal LAD, less than 30% soft plaque in the proximal circumflex and less than 30% mixed plaque in the mid RCA.  Lipid studies were notable for an LDL cholesterol of 124.  He has not been on any lipid-lowering therapy.  His resting pulse typically is elevated mild shortness of breath with walking.   When I saw him in January 2020 in light of evidence for mild coronary plaque I discussed the importance of aggressive lipid intervention to potentially induce plaque regression. I initiated therapy with rosuvastatin 20 mg.  In addition, his resting pulse was 99.  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  I recommended the addition of low-dose metoprolol succinate at 25 mg which would help his to lower his resting heart rate and attenuate his submaximal heart rate with  activity.   I saw him in a telemedicine evaluation in April 2020.  At that time he continued to feel well.  Over the past several months, he has felt well.  He has been taking the rosuvastatin but he was concerned perhaps that the 20 mg dose was attributing to some of his reflux.  As result he self reduced it to 10 mg for several weeks.  Apparently he had spoken to a nurse recently and for the past week has been back on the 20 mg regimen.  He has noticed his resting pulse is improved with the addition of low-dose metoprolol.  His shortness of breath is slightly  improved.  He denied chest pain,  PND, orthopnea or palpitations.   I evaluated him in a telemedicine encounter on December 30, 2019 and over the prior 9 months he continued to be stable.  His pulse has been consistently in the 60s to low 70s.  His blood pressure has been stable.  He had repeat laboratory with Dr. Vassie Loll.  LDL cholesterol had improved from 124 down to 49 and triglycerides from 191 down to 102 on rosuvastatin.  He was to have repeat laboratory with Dr. Felipa Eth in March 2021.  He continued to be active.  He denied any significant weight gain.    I saw him on July 29, 2021 at which time he continued to feel well and specifically denied any chest pain, shortness of breath or palpitations.  He does occasionally have GERD symptoms.  He enjoys fishing and hunting.  Dr. Felipa Eth will be rechecking laboratory.  LDL cholesterol in August 2021 was 58 on rosuvastatin 20 mg.  He has continued to be on metoprolol succinate 25 mg daily and olmesartan 20 mg.    I last saw him on October 01, 2022.  He continued to be asymptomatic and was walking daily and typically at least 4000-6000 steps.  There is no chest pain or change in exercise capacity.  He continues to be on metoprolol succinate 25 mg daily, olmesartan 20 mg for hypertension.  He is on rosuvastatin 20 mg for hyperlipidemia.  He continues to be on a baby aspirin and takes omeprazole for GERD.  His resting heart rate was stable.  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  Since I last saw him, He admits that he has slowed down a bit compared to last year.  His wife had undergone knee surgery and he has been having to care for her since her mobility initially was very poor.  He denies chest pain or shortness of breath but still continues to walk.  He continues to be on aspirin 81 mg, metoprolol succinate 25 mg, olmesartan 20 mg for blood pressure.  He is on rosuvastatin 20 mg daily for hyperlipidemia and takes  over-the-counter omeprazole for GERD.  He presents for yearly evaluation.   Past Medical History:  Diagnosis Date   Allergy    Appendicitis    Cholecystitis    Cholelithiasis    Diverticulosis    Gallstones    GERD (gastroesophageal reflux disease)    H pylori ulcer    Hemorrhoids    Hiatal hernia    Hyperlipidemia    Hypertension    Kidney stones    Peptic ulcer    history of    Past Surgical History:  Procedure Laterality Date   APPENDECTOMY     CHOLECYSTECTOMY     COLONOSCOPY     INGUINAL HERNIA REPAIR  left   KIDNEY STONE SURGERY     lithotripsy and removal of kidney stone   UMBILICAL HERNIA REPAIR      Current Medications: Outpatient Medications Prior to Visit  Medication Sig Dispense Refill   Ascorbic Acid (VITAMIN C PO) Take 500 mg by mouth daily.      aspirin EC 81 MG tablet Take 81 mg by mouth daily.     calcium carbonate (TUMS - DOSED IN MG ELEMENTAL CALCIUM) 500 MG chewable tablet Chew 2 tablets by mouth at bedtime.     Cholecalciferol 125 MCG (5000 UT) capsule Take 5,000 Units by mouth daily.     Cyanocobalamin (VITAMIN B-12) 5000 MCG TBDP Take 5,000 mcg by mouth daily.     metoprolol succinate (TOPROL-XL) 25 MG 24 hr tablet TAKE 1 TABLET(25 MG) BY MOUTH DAILY 30 tablet 2   Multiple Vitamins-Minerals (OCUVITE PO) Take by mouth daily at 6 (six) AM.     olmesartan (BENICAR) 20 MG tablet Take 20 mg by mouth daily.     omeprazole (PRILOSEC OTC) 20 MG tablet Take 20 mg by mouth daily.     rosuvastatin (CRESTOR) 20 MG tablet TAKE 1 TABLET(20 MG) BY MOUTH DAILY 90 tablet 3   Facility-Administered Medications Prior to Visit  Medication Dose Route Frequency Provider Last Rate Last Admin   0.9 %  sodium chloride infusion  500 mL Intravenous Once Hilarie Fredrickson, MD         Allergies:   Penicillins   Social History   Socioeconomic History   Marital status: Married    Spouse name: Not on file   Number of children: 2   Years of education: Not on file    Highest education level: Not on file  Occupational History   Occupation: retired  Tobacco Use   Smoking status: Former    Current packs/day: 0.00    Types: Cigarettes    Quit date: 12/01/1978    Years since quitting: 44.9   Smokeless tobacco: Former    Quit date: 12/02/1975  Vaping Use   Vaping status: Never Used  Substance and Sexual Activity   Alcohol use: Yes    Comment: ocassional wine  and beer   Drug use: No   Sexual activity: Not on file  Other Topics Concern   Not on file  Social History Narrative   Not on file   Social Determinants of Health   Financial Resource Strain: Not on file  Food Insecurity: Not on file  Transportation Needs: Not on file  Physical Activity: Not on file  Stress: Not on file  Social Connections: Not on file     Additional social history is notable that he is married for > 60 years.  He has 2 children, 5 grandchildren, and 4 great-grandchildren.  He completed ninth grade but received his GED degree.  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  Family History:  The patient's family history includes CAD in his mother; CVA in his mother and another family member; Diabetes in his brother, brother, father, mother, sister, and another family member; Fibromyalgia in his daughter; Heart attack in his brother and brother; Heart disease in his brother, brother, brother, brother, and mother; Hypertension in his mother and son; Stomach cancer in his paternal grandfather; Stroke in his maternal grandmother and paternal grandmother.   ROS General: Negative; No fevers, chills, or night sweats;  HEENT: Negative; No changes in vision or hearing, sinus congestion, difficulty swallowing Pulmonary: Negative; No  cough, wheezing, hemoptysis Cardiovascular: See HPI GI: Negative; No nausea, vomiting, diarrhea, or abdominal pain GU: Negative; No dysuria, hematuria, or difficulty voiding Musculoskeletal: Negative; no myalgias, joint pain, or  weakness Hematologic/Oncology: Negative; no easy bruising, bleeding Endocrine: Negative; no heat/cold intolerance; no diabetes Neuro: Negative; no changes in balance, headaches Skin: Negative; No rashes or skin lesions Psychiatric: Negative; No behavioral problems, depression Sleep: Negative; No snoring, daytime sleepiness, hypersomnolence, bruxism, restless legs, hypnogognic hallucinations, no cataplexy Other comprehensive 14 point system review is negative.   PHYSICAL EXAM:   VS:  BP 120/88   Pulse 85   Ht 5\' 10"  (1.778 m)   Wt 210 lb 12.8 oz (95.6 kg)   SpO2 97%   BMI 30.25 kg/m     Repeat blood pressure by me was excellent at 106/78  Wt Readings from Last 3 Encounters:  10/22/23 210 lb 12.8 oz (95.6 kg)  10/01/22 207 lb (93.9 kg)  07/29/21 211 lb 3.2 oz (95.8 kg)    General: Alert, oriented, no distress.  Skin: normal turgor, no rashes, warm and dry HEENT: Normocephalic, atraumatic. Pupils equal round and reactive to light; sclera anicteric; extraocular muscles intact;  Nose without nasal septal hypertrophy Mouth/Parynx benign; Mallinpatti scale 3 Neck: No JVD, no carotid bruits; normal carotid upstroke Lungs: clear to ausculatation and percussion; no wheezing or rales Chest wall: without tenderness to palpitation Heart: PMI not displaced, RRR, s1 s2 normal, 1/6 systolic murmur, no diastolic murmur, no rubs, gallops, thrills, or heaves Abdomen: soft, nontender; no hepatosplenomehaly, BS+; abdominal aorta nontender and not dilated by palpation. Back: no CVA tenderness Pulses 2+ Musculoskeletal: full range of motion, normal strength, no joint deformities Extremities: no clubbing cyanosis or edema, Homan's sign negative  Neurologic: grossly nonfocal; Cranial nerves grossly wnl Psychologic: Normal mood and affect    Studies/Labs Reviewed:  EKG Interpretation Date/Time:  Thursday October 22 2023 14:31:42 EST Ventricular Rate:  85 PR Interval:  184 QRS  Duration:  128 QT Interval:  390 QTC Calculation: 464 R Axis:   -66  Text Interpretation: Normal sinus rhythm Left axis deviation Right bundle branch block Minimal voltage criteria for LVH, may be normal variant ( R in aVL ) Inferior infarct , age undetermined When compared with ECG of 13-Dec-2009 10:32, Right bundle branch block is now Present Confirmed by Nicki Guadalajara ([id number]) on 10/22/2023 3:13:14 PM    October 01, 2022 ECG (independently read by me): NSR at 71, RBBB, LAHB  July 29, 2021 ECG (independently read by me):  NSR at 74; Bifascular block with RBBB, LAHB  January 2020 ECG (independently read by me): Normal sinus rhythm at 99 bpm.  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  PR interval 176 ms; QTc interval 492 ms  October 21, 2018 ECG (independently read by me): Sinus rhythm at 95 bpm.  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  No ectopy.  Recent Labs:    Latest Ref Rng & Units 05/03/2019    9:37 AM 11/16/2018    9:14 AM 09/18/2010   11:43 AM  BMP  Glucose 65 - 99 mg/dL 87  84  85   BUN 8 - 27 mg/dL 22  12  12    Creatinine 0.76 - 1.27 mg/dL 6.04  5.40  9.81   BUN/Creat Ratio 10 - 24 22  13     Sodium 134 - 144 mmol/L 139  140  140   Potassium 3.5 - 5.2 mmol/L 5.0  5.0  4.4   Chloride  96 - 106 mmol/L 100  101  108   CO2 20 - 29 mmol/L 24  24  26    Calcium 8.6 - 10.2 mg/dL 9.5  9.6  9.1         Latest Ref Rng & Units 05/03/2019    9:37 AM 11/16/2018    9:14 AM 08/06/2006   11:54 AM  Hepatic Function  Total Protein 6.0 - 8.5 g/dL 6.4  7.0  6.9   Albumin 3.7 - 4.7 g/dL 4.3  4.5  4.2   AST 0 - 40 IU/L 30  27  23    ALT 0 - 44 IU/L 31  20  26    Alk Phosphatase 39 - 117 IU/L 73  90  86   Total Bilirubin 0.0 - 1.2 mg/dL 0.8  0.8  0.8        Latest Ref Rng & Units 11/16/2018    9:14 AM 09/18/2010   11:43 AM 12/14/2009    7:15 AM  CBC  WBC 3.4 - 10.8 x10E3/uL 8.7  7.6    Hemoglobin 13.0 - 17.7 g/dL 30.8  65.7  84.6    Hematocrit 37.5 - 51.0 % 42.7  44.1    Platelets 150 - 450 x10E3/uL 148  230     Lab Results  Component Value Date   MCV 88 11/16/2018   MCV 88.0 09/18/2010   MCV 89.2 09/11/2006   Lab Results  Component Value Date   TSH 2.740 11/16/2018   No results found for: "HGBA1C"   BNP No results found for: "BNP"  ProBNP No results found for: "PROBNP"   Lipid Panel     Component Value Date/Time   CHOL 102 05/03/2019 0937   TRIG 66 05/03/2019 0937   HDL 40 05/03/2019 0937   CHOLHDL 2.6 05/03/2019 0937   LDLCALC 49 05/03/2019 0937     RADIOLOGY: No results found.   Additional studies/ records that were reviewed today include:   I reviewed laboratory from [facility] from June 30, 2018.  Total cholesterol 164, HDL 46, LDL 104, triglycerides 69. Hemoglobin 14.5.  Creatinine 0.9.  ALT 18.  TSH 1.1  I have reviewed laboratory from Dr. Antionette Char.   ASSESSMENT:    1. Essential hypertension   2. Coronary artery calcification   3. 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 Grade I diastolic dysfunction   6. Gastroesophageal reflux disease without esophagitis     PLAN:  [name] is a young appearing 82 year old gentleman who has a several year history of hypertension.   He also has a history of mild hyperlipidemia and remotely had been on simvastatin and presently is on rosuvastatin 20 mg daily.  He has a history of GERD for which he takes omeprazole.  When I initially evaluated him he had exertional dyspnea typically with walking uphill and improved with rest.  He has been documented to have hyperdynamic LV function with grade 1 diastolic dysfunction on echocardiography.  PA pressure was minimally increased at 33 mm.  There was mild aortic regurgitation and mild left atrial dilatation.  Coronary CTA demonstrated minimal coronary calcification with a calcium score of 8.  There was mild calcification in the  mid RCA, distal left main and proximal LAD.  Subsequent to his CTA, he was started on rosuvastatin after having been off simvastatin for some time.  Prior to initiation of rosuvastatin, his LDL cholesterol has risen to 124 and had  reduced to 58 in August 2021.  At his evaluation in September 2022 LDL was further suppressed at 36 with total cholesterol 84.  Currently, [name] blood pressure today is stable and on recheck by me was 106/78.  He remains active but is not quite as active as last year mainly due to having to care for his wife who had undergone knee surgery.  He continues to be on metoprolol succinate 25 mg daily and olmesartan 20 mg for blood pressure control.  He is on baby aspirin.  He is tolerating rosuvastatin 20 mg.  Lipid studies on October 06, 2023 now show LDL cholesterol at 39 with total cholesterol 98.  He continues to take omeprazole for GERD.  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  He continues to be followed by Dr. Felipa Eth at [facility] for primary care.  I discussed my plans for retirement in mid 2025.  As result, I have recommended 1 year follow-up evaluation and we will transition him to the care of Dr. Epifanio Lesches in our office.   Medication Adjustments/Labs and Tests Ordered: Current medicines are reviewed at length with the patient today.  Concerns regarding medicines are outlined above.  Medication changes, Labs and Tests ordered today are listed in the Patient Instructions below. Patient Instructions  Medication Instructions:  No medication changes were made during today's visit  *If you need a refill on your cardiac medications before your next appointment, please call your pharmacy*   Lab Work: No labs were order during today's visit.  If you have labs (blood work) drawn today and your tests are completely normal, you will receive your results only by: MyChart Message (if you  have MyChart) OR A paper copy in the mail If you have any lab test that is abnormal or we need to change your treatment, we will call you to review the results.   Testing/Procedures: No procedures ordered today.    Follow-Up: At [facility], you and your health needs are our priority.  As part of our continuing mission to provide you with exceptional heart care, we have created designated Provider Care Teams.  These Care Teams include your primary Cardiologist (physician) and Advanced Practice Providers (APPs -  Physician Assistants and Nurse Practitioners) who all work together to provide you with the care you need, when you need it.  We recommend signing up for the patient portal called "MyChart".  Sign up information is provided on this After Visit Summary.  MyChart is used to connect with patients for Virtual Visits (Telemedicine).  Patients are able to view lab/test results, encounter notes, upcoming appointments, etc.  Non-urgent messages can be sent to your provider as well.   To learn more about what you can do with MyChart, go to [email or web address].    Your next appointment:   1 year(s)  Provider:   Dr. Epifanio Lesches     Signed, Nicki Guadalajara, MD  11/01/2023 9:17 AM    [facility] [address], [address], [place], [place]  [address] Phone: [phone]

## 2023-11-01 ENCOUNTER — Encounter: Payer: Self-pay | Admitting: Cardiovascular Disease

## 2023-11-18 ENCOUNTER — Other Ambulatory Visit: Payer: Self-pay | Admitting: Cardiovascular Disease

## 2023-12-15 ENCOUNTER — Other Ambulatory Visit: Payer: Self-pay | Admitting: Cardiovascular Disease

## 2024-02-25 ENCOUNTER — Encounter: Payer: Self-pay | Admitting: Cardiovascular Disease

## 2024-02-25 ENCOUNTER — Ambulatory Visit: Attending: Cardiovascular Disease | Admitting: Cardiovascular Disease

## 2024-02-25 VITALS — BP 128/66 | HR 65 | Ht 70.0 in | Wt 211.8 lb

## 2024-02-25 DIAGNOSIS — I10 Essential (primary) hypertension: Secondary | ICD-10-CM | POA: Diagnosis not present

## 2024-02-25 DIAGNOSIS — I452 Bifascicular block: Secondary | ICD-10-CM

## 2024-02-25 DIAGNOSIS — R0602 Shortness of breath: Secondary | ICD-10-CM

## 2024-02-25 DIAGNOSIS — E785 Hyperlipidemia, unspecified: Secondary | ICD-10-CM

## 2024-02-25 DIAGNOSIS — I2510 Atherosclerotic heart disease of native coronary artery without angina pectoris: Secondary | ICD-10-CM | POA: Diagnosis not present

## 2024-02-25 NOTE — Progress Notes (Signed)
 Cardiology Office Note    Date:  02/27/2024   ID:  Da, Authement [birth year], MRN [id number]  PCP:  Chilton Greathouse, MD  Cardiologist:  Nicki Guadalajara, MD   12 month F/U Cardiology evaluation  I care for [name].  History of Present Illness:  [name] is a 83 y.o. male who was initially self-referred for evaluation of exertional shortness of breath.  I saw him for initial evaluation in October 21, 2018 and last evaluated him on October 22, 2023.  He presents for a 51-month follow-up evaluation.  [name] has a history of hypertension and has been on olmesartan 20 mg daily.  He also has a history of GERD for which he takes omeprazole.  Remotely, he had been on simvastatin for lipid-lowering therapy.  He has a history of mild shortness of breath with activity.  More recently he has noticed perhaps slight increase with the shortness of breath particularly with uphill walking.  He hunts for his leisure and often notes shortness of breath with moving fast up hills.  He denies any definitive only associated chest heaviness.  He denies any PND, orthopnea, presyncope or syncope and denies any awareness of palpitations.  He has strong family history for heart disease that his mother had suffered a stroke and also had CABG revascularization surgery.  All 4 of his brothers have coronary artery disease of which 2 are deceased, one who had CABG revascularization surgery and another who had a MI.  His 2 living brothers have had coronary stents.  He has remote tobacco history and had smoked for approximately 15  years but quit approximately 40 years ago.   When I initially saw him, I recommended that he undergo a 2D echo Doppler study to evaluate systolic and diastolic function in addition to his valvular architecture.  With his significant CAD family history of also recommended a coronary CT angiogram for further evaluation of exertional dyspnea to make certain it was not ischemia  mediated.  His echo Doppler study was done on October 26, 2018 and showed an EF of 60 to 65%.  He had grade 1 diastolic dysfunction.  There was mild aortic regurgitation.  PA pressure was mildly increased at 33 mm.  Coronary CTA yielded a calcium score of 12 which is the 8th percentile for age and sex.  He was noted to have mild coronary plaque that was nonobstructive with less than 50% mixed plaque in the distal left main, less than 30% mixed plaque in the proximal LAD, less than 30% soft plaque in the proximal circumflex and less than 30% mixed plaque in the mid RCA.  Lipid studies were notable for an LDL cholesterol of 124.  He has not been on any lipid-lowering therapy.  His resting pulse typically is elevated mild shortness of breath with walking.   When I saw him in January 2020 in light of evidence for mild coronary plaque I discussed the importance of aggressive lipid intervention to potentially induce plaque regression. I initiated therapy with rosuvastatin 20 mg.  In addition, his resting pulse was 99.  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  I recommended the addition of low-dose metoprolol succinate at 25 mg which would help his to lower his resting heart rate and attenuate his submaximal heart rate with activity.   I saw him in a telemedicine evaluation in April 2020.  At that time he  continued to feel well.  Over the past several months, he has felt well.  He has been taking the rosuvastatin but he was concerned perhaps that the 20 mg dose was attributing to some of his reflux.  As result he self reduced it to 10 mg for several weeks.  Apparently he had spoken to a nurse recently and for the past week has been back on the 20 mg regimen.  He has noticed his resting pulse is improved with the addition of low-dose metoprolol.  His shortness of breath is slightly improved.  He denied chest pain,  PND, orthopnea or palpitations.   I  evaluated him in a telemedicine encounter on December 30, 2019 and over the prior 9 months he continued to be stable.  His pulse has been consistently in the 60s to low 70s.  His blood pressure has been stable.  He had repeat laboratory with Dr. Vassie Loll.  LDL cholesterol had improved from 124 down to 49 and triglycerides from 191 down to 102 on rosuvastatin.  He was to have repeat laboratory with Dr. Felipa Eth in March 2021.  He continued to be active.  He denied any significant weight gain.    I saw him on July 29, 2021 at which time he continued to feel well and specifically denied any chest pain, shortness of breath or palpitations.  He does occasionally have GERD symptoms.  He enjoys fishing and hunting.  Dr. Felipa Eth will be rechecking laboratory.  LDL cholesterol in August 2021 was 58 on rosuvastatin 20 mg.  He has continued to be on metoprolol succinate 25 mg daily and olmesartan 20 mg.    I saw him on October 01, 2022.  He continued to be asymptomatic and was walking daily and typically at least 4000-6000 steps.  There is no chest pain or change in exercise capacity.  He continues to be on metoprolol succinate 25 mg daily, olmesartan 20 mg for hypertension.  He is on rosuvastatin 20 mg for hyperlipidemia.  He continues to be on a baby aspirin and takes omeprazole for GERD.  His resting heart rate was stable.  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  I last saw him on October 22, 2023 at which time he admitted that he had slowed down over the past year.  His wife had undergone knee surgery and he has been having to care for her since her mobility initially was very poor.  He denies chest pain or shortness of breath but still continues to walk.  He continues to be on aspirin 81 mg, metoprolol succinate 25 mg, olmesartan 20 mg for blood pressure.  He is on rosuvastatin 20 mg daily for hyperlipidemia and takes over-the-counter omeprazole for GERD.  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  Presently, he has noticed some increased shortness of breath with less activity.  He is walking slower.  He admits to significant fatigability with activity.  He continues to see Dr. Vassie Loll for primary care.  He is on Lexapro for anxiety.  He takes aspirin 81 mg for antiplatelet therapy.  He continues to be on olmesartan 20 mg for hypertension, rosuvastatin 20 mg for hyperlipidemia and Prilosec for GERD.  He presents for evaluation.   Past Medical History:  Diagnosis Date   Allergy    Appendicitis    Cholecystitis    Cholelithiasis    Diverticulosis    Gallstones  GERD (gastroesophageal reflux disease)    H pylori ulcer    Hemorrhoids    Hiatal hernia    Hyperlipidemia    Hypertension    Kidney stones    Peptic ulcer    history of    Past Surgical History:  Procedure Laterality Date   APPENDECTOMY     CHOLECYSTECTOMY     COLONOSCOPY     INGUINAL HERNIA REPAIR     left   KIDNEY STONE SURGERY     lithotripsy and removal of kidney stone   UMBILICAL HERNIA REPAIR      Current Medications: Outpatient Medications Prior to Visit  Medication Sig Dispense Refill   Ascorbic Acid (VITAMIN C PO) Take 500 mg by mouth daily.      aspirin EC 81 MG tablet Take 81 mg by mouth daily.     Azelastine-Fluticasone (DYMISTA) 137-50 MCG/ACT SUSP Place into the nose.     calcium carbonate (TUMS - DOSED IN MG ELEMENTAL CALCIUM) 500 MG chewable tablet Chew 2 tablets by mouth at bedtime.     Cholecalciferol 125 MCG (5000 UT) capsule Take 5,000 Units by mouth daily.     Cyanocobalamin (VITAMIN B-12) 5000 MCG TBDP Take 5,000 mcg by mouth daily.     escitalopram (LEXAPRO) 5 MG tablet Take 5 mg by mouth daily.     esomeprazole (NEXIUM) 40 MG capsule Take by mouth.     metoprolol succinate (TOPROL-XL) 25 MG 24 hr tablet TAKE 1 TABLET(25 MG) BY MOUTH DAILY 30 tablet 9   Multiple  Vitamins-Minerals (OCUVITE PO) Take by mouth daily at 6 (six) AM.     olmesartan (BENICAR) 20 MG tablet Take 20 mg by mouth daily.     omeprazole (PRILOSEC OTC) 20 MG tablet Take 20 mg by mouth daily.     rosuvastatin (CRESTOR) 20 MG tablet TAKE 1 TABLET(20 MG) BY MOUTH DAILY 90 tablet 3   traZODone (DESYREL) 50 MG tablet Take 50 mg by mouth at bedtime.     Facility-Administered Medications Prior to Visit  Medication Dose Route Frequency Provider Last Rate Last Admin   0.9 %  sodium chloride infusion  500 mL Intravenous Once Hilarie Fredrickson, MD         Allergies:   Penicillins   Social History   Socioeconomic History   Marital status: Married    Spouse name: Not on file   Number of children: 2   Years of education: Not on file   Highest education level: Not on file  Occupational History   Occupation: retired  Tobacco Use   Smoking status: Former    Current packs/day: 0.00    Types: Cigarettes    Quit date: 12/01/1978    Years since quitting: 45.2   Smokeless tobacco: Former    Quit date: 12/02/1975  Vaping Use   Vaping status: Never Used  Substance and Sexual Activity   Alcohol use: Yes    Comment: ocassional wine  and beer   Drug use: No   Sexual activity: Not on file  Other Topics Concern   Not on file  Social History Narrative   Not on file   Social Drivers of Health   Financial Resource Strain: Not on file  Food Insecurity: Not on file  Transportation Needs: Not on file  Physical Activity: Not on file  Stress: Not on file  Social Connections: Not on file     Additional social history is notable that he is married for > 60 years.  He has 2 children, 5 grandchildren, and 4 great-grandchildren.  He completed ninth grade but received his GED degree.  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  Family History:  The patient's family history includes CAD in his mother; CVA in his mother and another family member; Diabetes in his brother, brother, father,  mother, sister, and another family member; Fibromyalgia in his daughter; Heart attack in his brother and brother; Heart disease in his brother, brother, brother, brother, and mother; Hypertension in his mother and son; Stomach cancer in his paternal grandfather; Stroke in his maternal grandmother and paternal grandmother.   ROS General: Negative; No fevers, chills, or night sweats;  HEENT: Negative; No changes in vision or hearing, sinus congestion, difficulty swallowing Pulmonary: Negative; No cough, wheezing, hemoptysis Cardiovascular: See HPI GI: Negative; No nausea, vomiting, diarrhea, or abdominal pain GU: Negative; No dysuria, hematuria, or difficulty voiding Musculoskeletal: Negative; no myalgias, joint pain, or weakness Hematologic/Oncology: Negative; no easy bruising, bleeding Endocrine: Negative; no heat/cold intolerance; no diabetes Neuro: Negative; no changes in balance, headaches Skin: Negative; No rashes or skin lesions Psychiatric: Anxiety Sleep: Negative; No snoring, daytime sleepiness, hypersomnolence, bruxism, restless legs, hypnogognic hallucinations, no cataplexy Other comprehensive 14 point system review is negative.   PHYSICAL EXAM:   VS:  BP 128/66   Pulse 65   Ht 5\' 10"  (1.778 m)   Wt 211 lb 12.8 oz (96.1 kg)   SpO2 97%   BMI 30.39 kg/m     Repeat blood pressure by me was excellent at 110/64  Wt Readings from Last 3 Encounters:  02/25/24 211 lb 12.8 oz (96.1 kg)  10/22/23 210 lb 12.8 oz (95.6 kg)  10/01/22 207 lb (93.9 kg)    General: Alert, oriented, no distress.  Skin: normal turgor, no rashes, warm and dry HEENT: Normocephalic, atraumatic. Pupils equal round and reactive to light; sclera anicteric; extraocular muscles intact; Nose without nasal septal hypertrophy Mouth/Parynx benign; Mallinpatti scale 3 Neck: No JVD, no carotid bruits; normal carotid upstroke Lungs: clear to ausculatation and percussion; no wheezing or rales Chest wall: without  tenderness to palpitation Heart: PMI not displaced, RRR, s1 s2 normal, 1/6 systolic murmur, no diastolic murmur, no rubs, gallops, thrills, or heaves Abdomen: soft, nontender; no hepatosplenomehaly, BS+; abdominal aorta nontender and not dilated by palpation. Back: no CVA tenderness Pulses 2+ Musculoskeletal: full range of motion, normal strength, no joint deformities Extremities: no clubbing cyanosis or edema, Homan's sign negative  Neurologic: grossly nonfocal; Cranial nerves grossly wnl Psychologic: Normal mood and affect    Studies/Labs Reviewed:    EKG Interpretation Date/Time:  Thursday February 25 2024 10:58:41 EDT Ventricular Rate:  65 PR Interval:  176 QRS Duration:  126 QT Interval:  440 QTC Calculation: 457 R Axis:   -51  Text Interpretation: Normal sinus rhythm with sinus arrhythmia Right bundle branch block Left anterior fascicular block Bifascicular block Minimal voltage criteria for LVH, may be normal variant ( R in aVL ) When compared with ECG of 22-Oct-2023 14:31, No significant change was found Confirmed by Nicki Guadalajara ([id number]) on 02/25/2024 11:59:54 AM    October 22 2023 ECG (independently read by me): NSR at 85, RBBB  October 01, 2022 ECG (independently read by me): NSR at 71, RBBB, LAHB  July 29, 2021 ECG (independently read by me):  NSR at 74; Bifascular block with RBBB, LAHB  January 2020 ECG (independently read by me): Normal sinus rhythm at 99 bpm.  Right bundle branch block with repolarization  changes, left anterior hemiblock.  PR interval 176 ms; QTc interval 492 ms  October 21, 2018 ECG (independently read by me): Sinus rhythm at 95 bpm.  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  No ectopy.  Recent Labs:    Latest Ref Rng & Units 05/03/2019    9:37 AM 11/16/2018    9:14 AM 09/18/2010   11:43 AM  BMP  Glucose 65 - 99 mg/dL 87  84  85   BUN 8 - 27 mg/dL 22  12  12    Creatinine 0.76 - 1.27 mg/dL 1.09  3.23  5.57   BUN/Creat Ratio 10 -  24 22  13     Sodium 134 - 144 mmol/L 139  140  140   Potassium 3.5 - 5.2 mmol/L 5.0  5.0  4.4   Chloride 96 - 106 mmol/L 100  101  108   CO2 20 - 29 mmol/L 24  24  26    Calcium 8.6 - 10.2 mg/dL 9.5  9.6  9.1         Latest Ref Rng & Units 05/03/2019    9:37 AM 11/16/2018    9:14 AM 08/06/2006   11:54 AM  Hepatic Function  Total Protein 6.0 - 8.5 g/dL 6.4  7.0  6.9   Albumin 3.7 - 4.7 g/dL 4.3  4.5  4.2   AST 0 - 40 IU/L 30  27  23    ALT 0 - 44 IU/L 31  20  26    Alk Phosphatase 39 - 117 IU/L 73  90  86   Total Bilirubin 0.0 - 1.2 mg/dL 0.8  0.8  0.8        Latest Ref Rng & Units 11/16/2018    9:14 AM 09/18/2010   11:43 AM 12/14/2009    7:15 AM  CBC  WBC 3.4 - 10.8 x10E3/uL 8.7  7.6    Hemoglobin 13.0 - 17.7 g/dL 32.2  02.5  42.7   Hematocrit 37.5 - 51.0 % 42.7  44.1    Platelets 150 - 450 x10E3/uL 148  230     Lab Results  Component Value Date   MCV 88 11/16/2018   MCV 88.0 09/18/2010   MCV 89.2 09/11/2006   Lab Results  Component Value Date   TSH 2.740 11/16/2018   No results found for: "HGBA1C"   BNP No results found for: "BNP"  ProBNP No results found for: "PROBNP"   Lipid Panel     Component Value Date/Time   CHOL 102 05/03/2019 0937   TRIG 66 05/03/2019 0937   HDL 40 05/03/2019 0937   CHOLHDL 2.6 05/03/2019 0937   LDLCALC 49 05/03/2019 0937     RADIOLOGY: No results found.   Additional studies/ records that were reviewed today include:   I reviewed laboratory from [facility] from June 30, 2018.  Total cholesterol 164, HDL 46, LDL 104, triglycerides 69. Hemoglobin 14.5.  Creatinine 0.9.  ALT 18.  TSH 1.1  I have reviewed laboratory from Dr. Antionette Char.   ASSESSMENT:    1. Essential hypertension   2. Coronary artery calcification   3. Bifascicular block   4. SOB (shortness of breath)   5. Hyperlipidemia with target LDL less than 70     PLAN:  [name] is an 83 year old gentleman who has a history of hypertension,  hyperlipidemia, GERD, and anxiety.  When I initially evaluated him he had exertional dyspnea typically with walking uphill and improved with rest.  He has been documented  to have hyperdynamic LV function with grade 1 diastolic dysfunction on echocardiography.  PA pressure was minimally increased at 33 mm.  There was mild aortic regurgitation and mild left atrial dilatation.  Coronary CTA demonstrated minimal coronary calcification with a calcium score of 8.  There was mild calcification in the mid RCA, distal left main and proximal LAD.  Subsequent to his CTA, he was started on rosuvastatin after having been off simvastatin for some time.  Prior to initiation of rosuvastatin, his LDL cholesterol has risen to 124 and had reduced to 58 in August 2021.  At his evaluation in September 2022 LDL was further suppressed at 36 with total cholesterol 84.  Since his last evaluation, he has continued to be followed by Dr. Vassie Loll.  He now presents with complaint of increasing shortness of breath with less activity.  He is walking slowly and experiences shortness of breath but denies any chest tightness.  He admits to fatigability with any activity.  His blood pressure today is stable on his regimen of metoprolol succinate 25 mg daily in addition to olmesartan 20 mg.  He has continued to be on rosuvastatin for hyperlipidemia.  He takes anxiety medication with Lexapro and is on a baby aspirin.  With his change in symptomatology, I am recommending he undergo a follow-up echo Doppler assessment.  I will also schedule him for a NM PET cardiac CT myocardial perfusion study.  He sees Dr. Felipa Eth who has checked laboratory.  He is aware of my upcoming retirement.  I will see him in 2 months following the above studies and at that time we will transition him to the care of Dr. Bjorn Pippin in our office.   Medication Adjustments/Labs and Tests Ordered: Current medicines are reviewed at length with the patient today.  Concerns regarding  medicines are outlined above.  Medication changes, Labs and Tests ordered today are listed in the Patient Instructions below. Patient Instructions  Medication Instructions:  No medication changes were made during today's visit.  *If you need a refill on your cardiac medications before your next appointment, please call your pharmacy*   Lab Work: No labs were ordered during today's visit.  If you have labs (blood work) drawn today and your tests are completely normal, you will receive your results only by: MyChart Message (if you have MyChart) OR A paper copy in the mail If you have any lab test that is abnormal or we need to change your treatment, we will call you to review the results.   Testing/Procedures: Your physician has requested that you have an echocardiogram. Echocardiography is a painless test that uses sound waves to create images of your heart. It provides your doctor with information about the size and shape of your heart and how well your heart's chambers and valves are working. This procedure takes approximately one hour. There are no restrictions for this procedure. Please do NOT wear cologne, perfume, aftershave, or lotions (deodorant is allowed). Please arrive 15 minutes prior to your appointment time.  Please note: We ask at that you not bring children with you during ultrasound (echo/ vascular) testing. Due to room size and safety concerns, children are not allowed in the ultrasound rooms during exams. Our front office staff cannot provide observation of children in our lobby area while testing is being conducted. An adult accompanying a patient to their appointment will only be allowed in the ultrasound room at the discretion of the ultrasound technician under special circumstances. We apologize for any inconvenience.  CARDIAC PET- Your physician has requested that you have a Cardiac Pet Stress Test.   This testing is completed at [facility] ([address] [place], [place] [address]) or [facility] ([address] [place], [place]). Please arrive 30 minutes prior to your scheduled time.  The schedulers will call you to get this scheduled. Please follow further testing instructions below.    Follow-Up: At [facility], you and your health needs are our priority.  As part of our continuing mission to provide you with exceptional heart care, we have created designated Provider Care Teams.  These Care Teams include your primary Cardiologist (physician) and Advanced Practice Providers (APPs -  Physician Assistants and Nurse Practitioners) who all work together to provide you with the care you need, when you need it.  We recommend signing up for the patient portal called "MyChart".  Sign up information is provided on this After Visit Summary.  MyChart is used to connect with patients for Virtual Visits (Telemedicine).  Patients are able to view lab/test results, encounter notes, upcoming appointments, etc.  Non-urgent messages can be sent to your provider as well.   To learn more about what you can do with MyChart, go to [email or web address].    Your next appointment:   2 month(s)  Provider:   Dr. Nicki Guadalajara     Other Instructions [facility]  [address] [place], [place] [address] [facility] [place] [phone]       1st Floor: - Lobby - Registration  - Pharmacy  - Lab - Cafe   2nd Floor: - PV Lab - Diagnostic Testing (echo, CT, nuclear med)   3rd Floor: - Vacant   4th Floor: - TCTS (cardiothoracic surgery) - AFib Clinic - Structural Heart Clinic - Vascular Surgery  - Vascular Ultrasound   5th Floor: - HeartCare Cardiology (general and EP) - Clinical Pharmacy for coumadin, hypertension, lipid, weight-loss medications, and med management appointments      Valet parking services will be available as well.           Signed, Nicki Guadalajara, MD   02/27/2024 1:23 PM    [facility] [address], [place], [place]  [address] Phone: [phone]

## 2024-02-25 NOTE — Patient Instructions (Signed)
 Medication Instructions:  No medication changes were made during today's visit.  *If you need a refill on your cardiac medications before your next appointment, please call your pharmacy*   Lab Work: No labs were ordered during today's visit.  If you have labs (blood work) drawn today and your tests are completely normal, you will receive your results only by: MyChart Message (if you have MyChart) OR A paper copy in the mail If you have any lab test that is abnormal or we need to change your treatment, we will call you to review the results.   Testing/Procedures: Your physician has requested that you have an echocardiogram. Echocardiography is a painless test that uses sound waves to create images of your heart. It provides your doctor with information about the size and shape of your heart and how well your heart's chambers and valves are working. This procedure takes approximately one hour. There are no restrictions for this procedure. Please do NOT wear cologne, perfume, aftershave, or lotions (deodorant is allowed). Please arrive 15 minutes prior to your appointment time.  Please note: We ask at that you not bring children with you during ultrasound (echo/ vascular) testing. Due to room size and safety concerns, children are not allowed in the ultrasound rooms during exams. Our front office staff cannot provide observation of children in our lobby area while testing is being conducted. An adult accompanying a patient to their appointment will only be allowed in the ultrasound room at the discretion of the ultrasound technician under special circumstances. We apologize for any inconvenience.    CARDIAC PET- Your physician has requested that you have a Cardiac Pet Stress Test.   This testing is completed at [facility] ([address] [place], [place] [address]) or [facility] ([address] [place], [place]). Please arrive 30 minutes prior  to your scheduled time.  The schedulers will call you to get this scheduled. Please follow further testing instructions below.    Follow-Up: At [facility], you and your health needs are our priority.  As part of our continuing mission to provide you with exceptional heart care, we have created designated Provider Care Teams.  These Care Teams include your primary Cardiologist (physician) and Advanced Practice Providers (APPs -  Physician Assistants and Nurse Practitioners) who all work together to provide you with the care you need, when you need it.  We recommend signing up for the patient portal called "MyChart".  Sign up information is provided on this After Visit Summary.  MyChart is used to connect with patients for Virtual Visits (Telemedicine).  Patients are able to view lab/test results, encounter notes, upcoming appointments, etc.  Non-urgent messages can be sent to your provider as well.   To learn more about what you can do with MyChart, go to [email or web address].    Your next appointment:   2 month(s)  Provider:   Dr. Nicki Guadalajara     Other Instructions [facility]  [address] [place], [place] [address] [facility] [phone]       1st Floor: - Lobby - Registration  - Pharmacy  - Lab - Cafe   2nd Floor: - PV Lab - Diagnostic Testing (echo, CT, nuclear med)   3rd Floor: - Vacant   4th Floor: - TCTS (cardiothoracic surgery) - AFib Clinic - Structural Heart Clinic - Vascular Surgery  - Vascular Ultrasound   5th Floor: - [facility] (general and EP) - Clinical Pharmacy  for coumadin, hypertension, lipid, weight-loss medications, and med management appointments      Valet parking services will be available as well.

## 2024-02-27 ENCOUNTER — Encounter: Payer: Self-pay | Admitting: Cardiovascular Disease

## 2024-03-29 ENCOUNTER — Ambulatory Visit (HOSPITAL_COMMUNITY)
Admission: RE | Admit: 2024-03-29 | Discharge: 2024-03-29 | Disposition: A | Discharge status: Home / Self Care | Source: Ambulatory Visit | Attending: Internal Medicine | Admitting: Internal Medicine

## 2024-03-29 DIAGNOSIS — I10 Essential (primary) hypertension: Secondary | ICD-10-CM | POA: Diagnosis present

## 2024-03-29 LAB — ECHOCARDIOGRAM COMPLETE
AR max vel: 2.25 cm2
AV Area VTI: 2.07 cm2
AV Area mean vel: 1.98 cm2
AV Mean grad: 3.5 mmHg
AV Peak grad: 6.2 mmHg
Ao pk vel: 1.25 m/s
Area-P 1/2: 3.58 cm2
S' Lateral: 2.9 cm

## 2024-05-09 ENCOUNTER — Ambulatory Visit: Admitting: Cardiovascular Disease

## 2024-05-16 ENCOUNTER — Telehealth (HOSPITAL_COMMUNITY): Payer: Self-pay | Admitting: *Deleted

## 2024-05-16 ENCOUNTER — Encounter (HOSPITAL_COMMUNITY): Payer: Self-pay

## 2024-05-16 NOTE — Telephone Encounter (Signed)
Attempted to call patient regarding upcoming cardiac PET appointment. Left message on voicemail with name and callback number  Monya Kozakiewicz RN Navigator Cardiac Imaging [facility] [phone] Office [phone] Cell  Reminder to avoid caffeine 12 hours prior to appt. 

## 2024-05-17 ENCOUNTER — Ambulatory Visit (HOSPITAL_COMMUNITY)
Admission: RE | Admit: 2024-05-17 | Discharge: 2024-05-17 | Disposition: A | Discharge status: Home / Self Care | Source: Ambulatory Visit | Attending: Cardiovascular Disease | Admitting: Cardiovascular Disease

## 2024-05-17 DIAGNOSIS — R0602 Shortness of breath: Secondary | ICD-10-CM | POA: Diagnosis present

## 2024-05-17 LAB — NM PET CT CARDIAC PERFUSION MULTI W/ABSOLUTE BLOODFLOW
LV dias vol: 101 mL (ref 62–150)
LV sys vol: 40 mL (ref 4.2–5.8)
MBFR: 2.37
Nuc Rest EF: 60 %
Nuc Stress EF: 67 %
Rest MBF: 0.83 ml/g/min
Rest Nuclear Isotope Dose: 24.8 mCi
ST Depression (mm): 0 mm
Stress MBF: 1.97 ml/g/min
Stress Nuclear Isotope Dose: 24.8 mCi

## 2024-05-17 MED ORDER — RUBIDIUM RB82 GENERATOR (RUBYFILL)
24.7500 | PACK | Freq: Once | INTRAVENOUS | Status: AC
  Administered 2024-05-17: 24.75 via INTRAVENOUS

## 2024-05-17 MED ORDER — REGADENOSON 0.4 MG/5ML IV SOLN
0.4000 mg | Freq: Once | INTRAVENOUS | Status: AC
  Administered 2024-05-17: 0.4 mg via INTRAVENOUS

## 2024-05-17 MED ORDER — REGADENOSON 0.4 MG/5ML IV SOLN
INTRAVENOUS | Status: AC
  Filled 2024-05-17: qty 5

## 2024-05-17 NOTE — Progress Notes (Signed)
 Pt. Tolerated lexi scan well.

## 2024-05-19 ENCOUNTER — Ambulatory Visit: Payer: Self-pay | Admitting: Cardiovascular Disease

## 2024-05-26 ENCOUNTER — Ambulatory Visit: Attending: Cardiovascular Disease | Admitting: Cardiovascular Disease

## 2024-05-26 ENCOUNTER — Encounter: Payer: Self-pay | Admitting: Cardiovascular Disease

## 2024-05-26 VITALS — BP 120/72 | HR 83 | Ht 70.0 in | Wt 210.2 lb

## 2024-05-26 DIAGNOSIS — E785 Hyperlipidemia, unspecified: Secondary | ICD-10-CM

## 2024-05-26 DIAGNOSIS — I10 Essential (primary) hypertension: Secondary | ICD-10-CM

## 2024-05-26 DIAGNOSIS — I2510 Atherosclerotic heart disease of native coronary artery without angina pectoris: Secondary | ICD-10-CM | POA: Diagnosis not present

## 2024-05-26 DIAGNOSIS — I452 Bifascicular block: Secondary | ICD-10-CM | POA: Diagnosis not present

## 2024-05-26 MED ORDER — ROSUVASTATIN CALCIUM 20 MG PO TABS: 20.0000 mg | ORAL_TABLET | Freq: Every day | ORAL | 4 refills | Status: AC

## 2024-05-26 NOTE — Progress Notes (Signed)
 Cardiology Office Note    Date:  05/26/2024   ID:  [name], Butcher 07/17/1941, MRN [id number]  PCP:  Janey Santos, MD  Cardiologist:  Debby Sor, MD   3 month F/U Cardiology evaluation  I care for [name].  History of Present Illness:  [name] is a 83 y.o. male who was initially self-referred for evaluation of exertional shortness of breath.  I saw him for initial evaluation in October 21, 2018 and last evaluated him on February 25, 2024.  He presents for a 18-month follow-up evaluation.  Mr. Denardo has a history of hypertension and has been on olmesartan 20 mg daily.  He also has a history of GERD for which he takes omeprazole.  Remotely, he had been on simvastatin for lipid-lowering therapy.  He has a history of mild shortness of breath with activity.  More recently he has noticed perhaps slight increase with the shortness of breath particularly with uphill walking.  He hunts for his leisure and often notes shortness of breath with moving fast up hills.  He denies any definitive only associated chest heaviness.  He denies any PND, orthopnea, presyncope or syncope and denies any awareness of palpitations.  He has strong family history for heart disease that his mother had suffered a stroke and also had CABG revascularization surgery.  All 4 of his brothers have coronary artery disease of which 2 are deceased, one who had CABG revascularization surgery and another who had a MI.  His 2 living brothers have had coronary stents.  He has remote tobacco history and had smoked for approximately 15  years but quit approximately 40 years ago.   When I initially saw him, I recommended that he undergo a 2D echo Doppler study to evaluate systolic and diastolic function in addition to his valvular architecture.  With his significant CAD family history of also recommended a coronary CT angiogram for further evaluation of exertional dyspnea to make certain it was not ischemia  mediated.  His echo Doppler study was done on October 26, 2018 and showed an EF of 60 to 65%.  He had grade 1 diastolic dysfunction.  There was mild aortic regurgitation.  PA pressure was mildly increased at 33 mm.  Coronary CTA yielded a calcium  score of 12 which is the 8th percentile for age and sex.  He was noted to have mild coronary plaque that was nonobstructive with less than 50% mixed plaque in the distal left main, less than 30% mixed plaque in the proximal LAD, less than 30% soft plaque in the proximal circumflex and less than 30% mixed plaque in the mid RCA.  Lipid studies were notable for an LDL cholesterol of 124.  He has not been on any lipid-lowering therapy.  His resting pulse typically is elevated mild shortness of breath with walking.   When I saw him in January 2020 in light of evidence for mild coronary plaque I discussed the importance of aggressive lipid intervention to potentially induce plaque regression. I initiated therapy with rosuvastatin  20 mg.  In addition, his resting pulse was 99.  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  I recommended the addition of low-dose metoprolol  succinate at 25 mg which would help his to lower his resting heart rate and attenuate his submaximal heart rate with activity.   I saw him in a telemedicine evaluation in April 2020.  At that time he  continued to feel well.  Over the past several months, he has felt well.  He has been taking the rosuvastatin  but he was concerned perhaps that the 20 mg dose was attributing to some of his reflux.  As result he self reduced it to 10 mg for several weeks.  Apparently he had spoken to a nurse recently and for the past week has been back on the 20 mg regimen.  He has noticed his resting pulse is improved with the addition of low-dose metoprolol .  His shortness of breath is slightly improved.  He denied chest pain,  PND, orthopnea or palpitations.   I  evaluated him in a telemedicine encounter on December 30, 2019 and over the prior 9 months he continued to be stable.  His pulse has been consistently in the 60s to low 70s.  His blood pressure has been stable.  He had repeat laboratory with Dr. Jude.  LDL cholesterol had improved from 124 down to 49 and triglycerides from 191 down to 102 on rosuvastatin .  He was to have repeat laboratory with Dr. Janey in March 2021.  He continued to be active.  He denied any significant weight gain.    I saw him on July 29, 2021 at which time he continued to feel well and specifically denied any chest pain, shortness of breath or palpitations.  He does occasionally have GERD symptoms.  He enjoys fishing and hunting.  Dr. Janey will be rechecking laboratory.  LDL cholesterol in August 2021 was 58 on rosuvastatin  20 mg.  He has continued to be on metoprolol  succinate 25 mg daily and olmesartan 20 mg.    I saw him on October 01, 2022.  He continued to be asymptomatic and was walking daily and typically at least 4000-6000 steps.  There is no chest pain or change in exercise capacity.  He continues to be on metoprolol  succinate 25 mg daily, olmesartan 20 mg for hypertension.  He is on rosuvastatin  20 mg for hyperlipidemia.  He continues to be on a baby aspirin and takes omeprazole for GERD.  His resting heart rate was stable.  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  I saw him on October 22, 2023 at which time he admitted that he had slowed down over the past year.  His wife had undergone knee surgery and he has been having to care for her since her mobility initially was very poor.  He denies chest pain or shortness of breath but still continues to walk.  He continues to be on aspirin 81 mg, metoprolol  succinate 25 mg, olmesartan 20 mg for blood pressure.  He is on rosuvastatin  20 mg daily for hyperlipidemia and takes over-the-counter omeprazole for GERD.  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  When I last saw him on February 25, 2024 he was starting to notice some increased shortness of breath with less activity.  He is walking slower.  He admits to significant fatigability with activity.  He continues to see Dr. Alva for primary care.  He is on Lexapro for anxiety.  He takes aspirin 81 mg for antiplatelet therapy.  He continues to be on olmesartan 20 mg for hypertension, rosuvastatin  20 mg for hyperlipidemia and Prilosec for GERD.  During that evaluation, I recommended he undergo follow-up echo Doppler evaluation and also schedule him for nuclear medicine PET cardiac CT stress study.  On March 29, 2024 he underwent 2D echo Doppler study which showed normal EF at 60 to 65% with grade 1 diastolic dysfunction and mild LVH.  There was mild MR, and mild to moderate AR without aortic stenosis.  Nuclear medicine PET CT cardiac perfusion study was done on May 17, 2024 which showed normal perfusion without ischemia or infarction.  Rest EF was 60% which increased to 67% with stress.  He had normal myocardial blood flow at 0.83 mL/g/min at rest which increased to 1.97 at stress.  Global myocardial blood flow reserve was 2.37 and was normal.  He was noted to have mild coronary calcification.  Presently, he feels well.  He is not very active.  He denies chest pain, shortness of breath, presyncope or syncope.  There are no palpitations.  He presents for follow-up evaluation.   Past Medical History:  Diagnosis Date   Allergy    Appendicitis    Cholecystitis    Cholelithiasis    Diverticulosis    Gallstones    GERD (gastroesophageal reflux disease)    H pylori ulcer    Hemorrhoids    Hiatal hernia    Hyperlipidemia    Hypertension    Kidney stones    Peptic ulcer    history of    Past Surgical History:  Procedure Laterality Date   APPENDECTOMY     CHOLECYSTECTOMY     COLONOSCOPY      INGUINAL HERNIA REPAIR     left   KIDNEY STONE SURGERY     lithotripsy and removal of kidney stone   UMBILICAL HERNIA REPAIR      Current Medications: Outpatient Medications Prior to Visit  Medication Sig Dispense Refill   Ascorbic Acid (VITAMIN C PO) Take 500 mg by mouth daily.      aspirin EC 81 MG tablet Take 81 mg by mouth daily.     Azelastine-Fluticasone (DYMISTA) 137-50 MCG/ACT SUSP Place into the nose.     calcium  carbonate (TUMS - DOSED IN MG ELEMENTAL CALCIUM ) 500 MG chewable tablet Chew 2 tablets by mouth at bedtime.     Cholecalciferol 125 MCG (5000 UT) capsule Take 5,000 Units by mouth daily.     Cyanocobalamin (VITAMIN B-12) 5000 MCG TBDP Take 5,000 mcg by mouth daily.     escitalopram (LEXAPRO) 5 MG tablet Take 5 mg by mouth daily.     esomeprazole (NEXIUM) 40 MG capsule Take by mouth.     metoprolol  succinate (TOPROL -XL) 25 MG 24 hr tablet TAKE 1 TABLET(25 MG) BY MOUTH DAILY 30 tablet 9   Multiple Vitamins-Minerals (OCUVITE PO) Take by mouth daily at 6 (six) AM.     olmesartan (BENICAR) 20 MG tablet Take 20 mg by mouth daily.     omeprazole (PRILOSEC OTC) 20 MG tablet Take 20 mg by mouth daily.     pantoprazole (PROTONIX) 40 MG tablet Take 40 mg by mouth daily.     rosuvastatin  (CRESTOR ) 20 MG tablet TAKE 1 TABLET(20 MG) BY MOUTH DAILY 90 tablet 3   traZODone (DESYREL) 50 MG tablet Take 50 mg by mouth at bedtime.     Facility-Administered Medications Prior to Visit  Medication Dose Route Frequency Provider Last Rate Last Admin   0.9 %  sodium chloride  infusion  500 mL Intravenous Once Abran Norleen SAILOR, MD         Allergies:   Penicillins   Social History   Socioeconomic History   Marital status: Married    Spouse name: Not on file  Number of children: 2   Years of education: Not on file   Highest education level: Not on file  Occupational History   Occupation: retired  Tobacco Use   Smoking status: Former    Current packs/day: 0.00    Types: Cigarettes     Quit date: 12/01/1978    Years since quitting: 45.5   Smokeless tobacco: Former    Quit date: 12/02/1975  Vaping Use   Vaping status: Never Used  Substance and Sexual Activity   Alcohol use: Yes    Comment: ocassional wine  and beer   Drug use: No   Sexual activity: Not on file  Other Topics Concern   Not on file  Social History Narrative   Not on file   Social Drivers of Health   Financial Resource Strain: Not on file  Food Insecurity: Not on file  Transportation Needs: Not on file  Physical Activity: Not on file  Stress: Not on file  Social Connections: Not on file     Additional social history is notable that he is married for > 60 years.  He has 2 children, 5 grandchildren, and 4 great-grandchildren.  He completed ninth grade but received his GED degree.  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  Family History:  The patient's family history includes CAD in his mother; CVA in his mother and another family member; Diabetes in his brother, brother, father, mother, sister, and another family member; Fibromyalgia in his daughter; Heart attack in his brother and brother; Heart disease in his brother, brother, brother, brother, and mother; Hypertension in his mother and son; Stomach cancer in his paternal grandfather; Stroke in his maternal grandmother and paternal grandmother.   ROS General: Negative; No fevers, chills, or night sweats;  HEENT: Negative; No changes in vision or hearing, sinus congestion, difficulty swallowing Pulmonary: Negative; No cough, wheezing, hemoptysis Cardiovascular: See HPI GI: Negative; No nausea, vomiting, diarrhea, or abdominal pain GU: Negative; No dysuria, hematuria, or difficulty voiding Musculoskeletal: Negative; no myalgias, joint pain, or weakness Hematologic/Oncology: Negative; no easy bruising, bleeding Endocrine: Negative; no heat/cold intolerance; no diabetes Neuro: Negative; no changes in balance, headaches Skin:  Negative; No rashes or skin lesions Psychiatric: Anxiety Sleep: Negative; No snoring, daytime sleepiness, hypersomnolence, bruxism, restless legs, hypnogognic hallucinations, no cataplexy Other comprehensive 14 point system review is negative.   PHYSICAL EXAM:   VS:  BP 120/72   Pulse 83   Ht 5' 10 (1.778 m)   Wt 210 lb 3.2 oz (95.3 kg)   SpO2 97%   BMI 30.16 kg/m     Repeat blood pressure by me was excellent at 110/64  Wt Readings from Last 3 Encounters:  05/26/24 210 lb 3.2 oz (95.3 kg)  02/25/24 211 lb 12.8 oz (96.1 kg)  10/22/23 210 lb 12.8 oz (95.6 kg)    General: Alert, oriented, no distress.  Skin: normal turgor, no rashes, warm and dry HEENT: Normocephalic, atraumatic. Pupils equal round and reactive to light; sclera anicteric; extraocular muscles intact; Nose without nasal septal hypertrophy Mouth/Parynx benign; Mallinpatti scale 3 Neck: No JVD, no carotid bruits; normal carotid upstroke Lungs: clear to ausculatation and percussion; no wheezing or rales Chest wall: without tenderness to palpitation Heart: PMI not displaced, RRR, s1 s2 normal, 1/6 systolic murmur, no diastolic murmur, no rubs, gallops, thrills, or heaves Abdomen: soft, nontender; no hepatosplenomehaly, BS+; abdominal aorta nontender and not dilated by palpation. Back: no CVA tenderness Pulses 2+ Musculoskeletal: full range of motion,  normal strength, no joint deformities Extremities: no clubbing cyanosis or edema, Homan's sign negative  Neurologic: grossly nonfocal; Cranial nerves grossly wnl Psychologic: Normal mood and affect    Studies/Labs Reviewed:   EKG Interpretation Date/Time:  Thursday May 26 2024 13:49:23 EDT Ventricular Rate:  83 PR Interval:  186 QRS Duration:  130 QT Interval:  408 QTC Calculation: 479 R Axis:   -57  Text Interpretation: Normal sinus rhythm Right bundle branch block Left anterior fascicular block Bifascicular block Minimal voltage criteria for LVH, may be  normal variant ( R in aVL ) Possible Lateral infarct , age undetermined When compared with ECG of 25-Feb-2024 10:58, Borderline criteria for Lateral infarct are now Present Confirmed by Burnard Ned ([id number]) on 05/26/2024 2:37:09 PM       February 25, 2024 ECG (independently read by me): NSR at 65 with sinus arrhythmia, RBBB. LAHB  October 22 2023 ECG (independently read by me): NSR at 85, RBBB  October 01, 2022 ECG (independently read by me): NSR at 71, RBBB, LAHB  July 29, 2021 ECG (independently read by me):  NSR at 74; Bifascular block with RBBB, LAHB  January 2020 ECG (independently read by me): Normal sinus rhythm at 99 bpm.  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  PR interval 176 ms; QTc interval 492 ms  October 21, 2018 ECG (independently read by me): Sinus rhythm at 95 bpm.  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  No ectopy.  Recent Labs:    Latest Ref Rng & Units 05/03/2019    9:37 AM 11/16/2018    9:14 AM 09/18/2010   11:43 AM  BMP  Glucose 65 - 99 mg/dL 87  84  85   BUN 8 - 27 mg/dL 22  12  12    Creatinine 0.76 - 1.27 mg/dL 8.97  9.10  9.00   BUN/Creat Ratio 10 - 24 22  13     Sodium 134 - 144 mmol/L 139  140  140   Potassium 3.5 - 5.2 mmol/L 5.0  5.0  4.4   Chloride 96 - 106 mmol/L 100  101  108   CO2 20 - 29 mmol/L 24  24  26    Calcium  8.6 - 10.2 mg/dL 9.5  9.6  9.1         Latest Ref Rng & Units 05/03/2019    9:37 AM 11/16/2018    9:14 AM 08/06/2006   11:54 AM  Hepatic Function  Total Protein 6.0 - 8.5 g/dL 6.4  7.0  6.9   Albumin 3.7 - 4.7 g/dL 4.3  4.5  4.2   AST 0 - 40 IU/L 30  27  23    ALT 0 - 44 IU/L 31  20  26    Alk Phosphatase 39 - 117 IU/L 73  90  86   Total Bilirubin 0.0 - 1.2 mg/dL 0.8  0.8  0.8        Latest Ref Rng & Units 11/16/2018    9:14 AM 09/18/2010   11:43 AM 12/14/2009    7:15 AM  CBC  WBC 3.4 - 10.8 x10E3/uL 8.7  7.6    Hemoglobin 13.0 - 17.7 g/dL 85.1  84.7  84.1   Hematocrit  37.5 - 51.0 % 42.7  44.1    Platelets 150 - 450 x10E3/uL 148  230     Lab Results  Component Value Date   MCV 88 11/16/2018   MCV 88.0 09/18/2010   MCV 89.2 09/11/2006  Lab Results  Component Value Date   TSH 2.740 11/16/2018   No results found for: HGBA1C   BNP No results found for: BNP  ProBNP No results found for: PROBNP   Lipid Panel     Component Value Date/Time   CHOL 102 05/03/2019 0937   TRIG 66 05/03/2019 0937   HDL 40 05/03/2019 0937   CHOLHDL 2.6 05/03/2019 0937   LDLCALC 49 05/03/2019 0937     RADIOLOGY: NM PET CT CARDIAC PERFUSION MULTI W/ABSOLUTE BLOODFLOW Result Date: 05/17/2024   LV perfusion is normal. There is no evidence of ischemia. There is no evidence of infarction.   Rest left ventricular function is normal. Rest EF: 60%. Stress left ventricular function is normal. Stress EF: 67%. End diastolic cavity size is normal. End systolic cavity size is normal.   Myocardial blood flow was computed to be 0.8ml/g/min at rest and 1.49ml/g/min at stress. Global myocardial blood flow reserve was 2.37 and was normal.   Coronary calcium  was present on the attenuation correction CT images. Mild coronary calcifications were present. Coronary calcifications were present in the left anterior descending artery and right coronary artery distribution(s).   The study is normal. The study is low risk. CLINICAL DATA:  This over-read does not include interpretation of cardiac or coronary anatomy or pathology. The Cardiac PET CT interpretation by the cardiologist is attached. COMPARISON:  11/23/2018 cardiac CTA FINDINGS: No pleural fluid.  Clear imaged lungs. Aortic atherosclerosis. No imaged thoracic adenopathy.  Moderate hiatal hernia. Cholecystectomy. Normal imaged portions of the liver, spleen, pancreas, adrenal glands. No acute osseous abnormality. IMPRESSION: No acute findings in the imaged extracardiac chest. Moderate hiatal hernia Aortic Atherosclerosis (ICD10-I70.0).  Electronically Signed   By: Rockey Kilts M.D.   On: 05/17/2024 08:41    Additional studies/ records that were reviewed today include:   I reviewed laboratory from [facility] medical from June 30, 2018.  Total cholesterol 164, HDL 46, LDL 104, triglycerides 69. Hemoglobin 14.5.  Creatinine 0.9.  ALT 18.  TSH 1.1  I have reviewed laboratory from Dr. Charlton. She is getting 1 to 2 minutes okay  ECHO: 03/29/2024  1. Left ventricular ejection fraction, by estimation, is 60 to 65%. Left  ventricular ejection fraction by PLAX is 61 %. The left ventricle has  normal function. The left ventricle has no regional wall motion  abnormalities. There is mild left ventricular  hypertrophy of the septal segment. Left ventricular diastolic parameters  are consistent with Grade I diastolic dysfunction (impaired relaxation).  Elevated left ventricular end-diastolic pressure.   2. Right ventricular systolic function is normal. The right ventricular  size is normal. There is normal pulmonary artery systolic pressure.   3. Left atrial size was mildly dilated.   4. The mitral valve is normal in structure. Mild mitral valve  regurgitation. No evidence of mitral stenosis.   5. The aortic valve is tricuspid. Aortic valve regurgitation is mild to  moderate. No aortic stenosis is present.   6. The inferior vena cava is normal in size with greater than 50%  respiratory variability, suggesting right atrial pressure of 3 mmHg.   Comparison(s): EF 60%, mild LVH, mild AI.   ASSESSMENT:    1. Essential hypertension   2. Coronary artery calcification   3. Bifascicular block   4. Hyperlipidemia with target LDL less than 70     PLAN:  Mr. Bekim Werntz is an 83 year old gentleman who has a history of hypertension, hyperlipidemia, GERD, and anxiety.  When I initially  evaluated him he had exertional dyspnea typically with walking uphill and improved with rest.  He has been documented to have hyperdynamic LV function with  grade 1 diastolic dysfunction on echocardiography.  PA pressure was minimally increased at 33 mm.  There was mild aortic regurgitation and mild left atrial dilatation.  Coronary CTA demonstrated minimal coronary calcification with a calcium  score of 8.  There was mild calcification in the mid RCA, distal left main and proximal LAD.  Subsequent to his CTA, he was started on rosuvastatin  after having been off simvastatin for some time.  Prior to initiation of rosuvastatin , his LDL cholesterol has risen to 124 and had reduced to 58 in August 2021.  At his evaluation in September 2022 LDL was further suppressed at 36 with total cholesterol 84.  At his office visit in March 2025, he was experiencing some mild shortness of breath with activity.  I reviewed his echo Doppler study which showed normal EF at 60 to 65%, mild LVH, and grade 1 diastolic dysfunction.  He had mild MR, and mild to moderate AR.  RV systolic pressure was normal.  I scheduled him for nuclear medicine PET cardiac perfusion stress study which was completed last week.  This was entirely normal with no scar or ischemia.  Myocardial blood flow was normal with normal global reserve.  Presently, he is doing well.  His blood pressure today is stable on his current regimen of metoprolol  succinate 25 mg daily in addition to olmesartan 20 mg.  He is on rosuvastatin  20 mg for lipid management.  LDL cholesterol in November 2024 was excellent at 39.  Clinically he is doing well.  He will see Dr. Avva for primary care.  He is aware of my retirement.  I will transition him to the care of Dr. Lonni Nanas and we will schedule follow-up evaluation in 6 to 8 months or sooner as needed.   Medication Adjustments/Labs and Tests Ordered: Current medicines are reviewed at length with the patient today.  Concerns regarding medicines are outlined above.  Medication changes, Labs and Tests ordered today are listed in the Patient Instructions below. There are no  Patient Instructions on file for this visit.   Signed, Debby Sor, MD  05/26/2024 2:21 PM    [facility] [address], [place], [place]  [address] Phone: [phone]

## 2024-05-26 NOTE — Patient Instructions (Signed)
 Medication Instructions:  Your physician recommends that you continue on your current medications as directed. Please refer to the Current Medication list given to you today.  *If you need a refill on your cardiac medications before your next appointment, please call your pharmacy*  Follow-Up: At [facility], you and your health needs are our priority.  As part of our continuing mission to provide you with exceptional heart care, our providers are all part of one team.  This team includes your primary Cardiologist (physician) and Advanced Practice Providers or APPs (Physician Assistants and Nurse Practitioners) who all work together to provide you with the care you need, when you need it.  Your next appointment:   6 month(s)  Provider:   Dr. Carson Clara   We recommend signing up for the patient portal called MyChart.  Sign up information is provided on this After Visit Summary.  MyChart is used to connect with patients for Virtual Visits (Telemedicine).  Patients are able to view lab/test results, encounter notes, upcoming appointments, etc.  Non-urgent messages can be sent to your provider as well.   To learn more about what you can do with MyChart, go to [email or web address].

## 2024-09-12 ENCOUNTER — Other Ambulatory Visit: Payer: Self-pay

## 2024-09-13 ENCOUNTER — Other Ambulatory Visit: Payer: Self-pay | Admitting: General Practice

## 2024-09-15 MED ORDER — METOPROLOL SUCCINATE ER 25 MG PO TB24: 25.0000 mg | ORAL_TABLET | Freq: Every day | ORAL | 2 refills | Status: AC

## 2024-09-25 ENCOUNTER — Encounter (HOSPITAL_BASED_OUTPATIENT_CLINIC_OR_DEPARTMENT_OTHER): Payer: Self-pay | Admitting: Emergency Medicine

## 2024-09-25 ENCOUNTER — Other Ambulatory Visit: Payer: Self-pay

## 2024-09-25 ENCOUNTER — Emergency Department (HOSPITAL_BASED_OUTPATIENT_CLINIC_OR_DEPARTMENT_OTHER)

## 2024-09-25 ENCOUNTER — Emergency Department (HOSPITAL_BASED_OUTPATIENT_CLINIC_OR_DEPARTMENT_OTHER)
Admission: EM | Admit: 2024-09-25 | Discharge: 2024-09-26 | Disposition: A | Discharge status: Home / Self Care | Attending: Emergency Medicine | Admitting: Emergency Medicine

## 2024-09-25 DIAGNOSIS — Z79899 Other long term (current) drug therapy: Secondary | ICD-10-CM | POA: Diagnosis not present

## 2024-09-25 DIAGNOSIS — N201 Calculus of ureter: Secondary | ICD-10-CM

## 2024-09-25 DIAGNOSIS — N132 Hydronephrosis with renal and ureteral calculous obstruction: Secondary | ICD-10-CM | POA: Insufficient documentation

## 2024-09-25 DIAGNOSIS — Z7982 Long term (current) use of aspirin: Secondary | ICD-10-CM | POA: Diagnosis not present

## 2024-09-25 DIAGNOSIS — R1031 Right lower quadrant pain: Secondary | ICD-10-CM | POA: Diagnosis present

## 2024-09-25 LAB — CBC WITH DIFFERENTIAL/PLATELET
Abs Immature Granulocytes: 0.03 K/uL (ref 0.00–0.07)
Basophils Absolute: 0.1 K/uL (ref 0.0–0.1)
Basophils Relative: 1 %
Eosinophils Absolute: 0.3 K/uL (ref 0.0–0.5)
Eosinophils Relative: 3 %
HCT: 43.2 % (ref 39.0–52.0)
Hemoglobin: 14.4 g/dL (ref 13.0–17.0)
Immature Granulocytes: 0 %
Lymphocytes Relative: 41 %
Lymphs Abs: 3.6 K/uL (ref 0.7–4.0)
MCH: 30.8 pg (ref 26.0–34.0)
MCHC: 33.3 g/dL (ref 30.0–36.0)
MCV: 92.5 fL (ref 80.0–100.0)
Monocytes Absolute: 0.7 K/uL (ref 0.1–1.0)
Monocytes Relative: 8 %
Neutro Abs: 4 K/uL (ref 1.7–7.7)
Neutrophils Relative %: 47 %
Platelets: 225 K/uL (ref 150–400)
RBC: 4.67 MIL/uL (ref 4.22–5.81)
RDW: 13 % (ref 11.5–15.5)
WBC: 8.7 K/uL (ref 4.0–10.5)
nRBC: 0 % (ref 0.0–0.2)

## 2024-09-25 LAB — COMPREHENSIVE METABOLIC PANEL WITH GFR
ALT: 25 U/L (ref 0–44)
AST: 37 U/L (ref 15–41)
Albumin: 4.1 g/dL (ref 3.5–5.0)
Alkaline Phosphatase: 89 U/L (ref 38–126)
Anion gap: 10 (ref 5–15)
BUN: 20 mg/dL (ref 8–23)
CO2: 26 mmol/L (ref 22–32)
Calcium: 9.2 mg/dL (ref 8.9–10.3)
Chloride: 104 mmol/L (ref 98–111)
Creatinine, Ser: 1.17 mg/dL (ref 0.61–1.24)
GFR, Estimated: >60 mL/min (ref >60)
Glucose, Bld: 115 mg/dL — ABNORMAL HIGH (ref 70–99)
Potassium: 4.4 mmol/L (ref 3.5–5.1)
Sodium: 141 mmol/L (ref 135–145)
Total Bilirubin: 0.7 mg/dL (ref 0.0–1.2)
Total Protein: 6.8 g/dL (ref 6.5–8.1)

## 2024-09-25 LAB — LIPASE, BLOOD: Lipase: 37 U/L (ref 11–51)

## 2024-09-25 MED ORDER — SODIUM CHLORIDE 0.9 % IV BOLUS
1000.0000 mL | Freq: Once | INTRAVENOUS | Status: AC
  Administered 2024-09-26: 1000 mL via INTRAVENOUS

## 2024-09-25 MED ORDER — ONDANSETRON HCL 4 MG/2ML IJ SOLN
4.0000 mg | Freq: Once | INTRAMUSCULAR | Status: AC
  Administered 2024-09-25: 4 mg via INTRAVENOUS
  Filled 2024-09-25: qty 2

## 2024-09-25 MED ORDER — MORPHINE SULFATE (PF) 4 MG/ML IV SOLN
4.0000 mg | Freq: Once | INTRAVENOUS | Status: AC
  Administered 2024-09-25: 4 mg via INTRAVENOUS
  Filled 2024-09-25: qty 1

## 2024-09-25 NOTE — ED Triage Notes (Signed)
 Pt c/o RLQ pain since 2pm today; +nausea; denies back pain, no urinary sxs

## 2024-09-25 NOTE — ED Provider Notes (Signed)
 [facility]  Provider Note  CSN: [id number] Arrival date & time: 09/25/24 2229  History Chief Complaint  Patient presents with   Abdominal Pain    [name] is a 83 y.o. male here for several hours of RLQ abdominal pain, associated with nausea. Pain waxes and wanes, sometimes in his back, no groin pain or dysuria. No fever. He has had prior cholecystectomy, appendectomy and lithotripsy. States this pain is similar to previous renal stone.    Home Medications Prior to Admission medications   Medication Sig Start Date End Date Taking? Authorizing Provider  HYDROcodone-acetaminophen  (NORCO/VICODIN) 5-325 MG tablet Take 1 tablet by mouth every 6 (six) hours as needed for severe pain (pain score 7-10). 09/26/24  Yes Roselyn Carlin NOVAK, MD  tamsulosin (FLOMAX) 0.4 MG CAPS capsule Take 1 capsule (0.4 mg total) by mouth daily. 09/26/24  Yes Roselyn Carlin NOVAK, MD  Ascorbic Acid (VITAMIN C PO) Take 500 mg by mouth daily.     [provider]  aspirin EC 81 MG tablet Take 81 mg by mouth daily.    [provider]  Azelastine-Fluticasone (DYMISTA) 137-50 MCG/ACT SUSP Place into the nose. 03/02/17   [provider]  calcium  carbonate (TUMS - DOSED IN MG ELEMENTAL CALCIUM ) 500 MG chewable tablet Chew 2 tablets by mouth at bedtime.    [provider]  Cholecalciferol 125 MCG (5000 UT) capsule Take 5,000 Units by mouth daily.    [provider]  Cyanocobalamin (VITAMIN B-12) 5000 MCG TBDP Take 5,000 mcg by mouth daily.    [provider]  escitalopram (LEXAPRO) 5 MG tablet Take 5 mg by mouth daily.    [provider]  esomeprazole (NEXIUM) 40 MG capsule Take by mouth. 02/25/17   [provider]  metoprolol  succinate (TOPROL -XL) 25 MG 24 hr tablet Take 1 tablet (25 mg total) by mouth daily. 09/15/24   Lelon Hamilton T, PA-C  Multiple Vitamins-Minerals (OCUVITE PO) Take by mouth daily  at 6 (six) AM.    [provider]  olmesartan (BENICAR) 20 MG tablet Take 20 mg by mouth daily.    [provider]  omeprazole (PRILOSEC OTC) 20 MG tablet Take 20 mg by mouth daily.    [provider]  pantoprazole (PROTONIX) 40 MG tablet Take 40 mg by mouth daily.    [provider]  rosuvastatin  (CRESTOR ) 20 MG tablet Take 1 tablet (20 mg total) by mouth daily. 05/26/24   Burnard Debby LABOR, MD  traZODone (DESYREL) 50 MG tablet Take 50 mg by mouth at bedtime. 02/07/24   [provider]     Allergies    Penicillins   Review of Systems   Review of Systems Please see HPI for pertinent positives and negatives  Physical Exam BP (!) 167/86 (BP Location: Right Arm)   Pulse 86   Temp 98.2 F (36.8 C) (Oral)   Resp 16   Ht 5' 10 (1.778 m)   Wt 95.3 kg   SpO2 97%   BMI 30.13 kg/m   Physical Exam Vitals and nursing note reviewed.  Constitutional:      Appearance: Normal appearance.  HENT:     Head: Normocephalic and atraumatic.     Nose: Nose normal.     Mouth/Throat:     Mouth: Mucous membranes are moist.  Eyes:     Extraocular Movements: Extraocular movements intact.     Conjunctiva/sclera: Conjunctivae normal.  Cardiovascular:  Rate and Rhythm: Normal rate.  Pulmonary:     Effort: Pulmonary effort is normal.     Breath sounds: Normal breath sounds.  Abdominal:     General: Abdomen is flat.     Palpations: Abdomen is soft.     Tenderness: There is abdominal tenderness in the right lower quadrant. There is no guarding. Negative signs include Murphy's sign and McBurney's sign.  Musculoskeletal:        General: No swelling. Normal range of motion.     Cervical back: Neck supple.  Skin:    General: Skin is warm and dry.  Neurological:     General: No focal deficit present.     Mental Status: He is alert.  Psychiatric:        Mood and Affect: Mood normal.     ED Results / Procedures / Treatments    EKG None  Procedures Procedures  Medications Ordered in the ED Medications  morphine (PF) 4 MG/ML injection 4 mg (4 mg Intravenous Given 09/25/24 2359)  ondansetron (ZOFRAN) injection 4 mg (4 mg Intravenous Given 09/25/24 2359)  sodium chloride  0.9 % bolus 1,000 mL (1,000 mLs Intravenous New Bag/Given 09/26/24 0001)  iohexol (OMNIPAQUE) 300 MG/ML solution 100 mL (100 mLs Intravenous Contrast Given 09/26/24 0000)    Initial Impression and Plan  Patient here with nonspecific RLQ abdominal pain, could be renal colic. Has had prior open appendectomy for ruptured appendicitis, consider stump appendicitis. Labs done in triage show unremarkable CBC, CMP and lipase. UA is pending. Will send for CT. Pain/nausea meds for comfort.   ED Course   Clinical Course as of 09/26/24 0059  Mon Sep 26, 2024  0026 I personally viewed the images from radiology studies and agree with radiologist interpretation: CT shows two right ureteral stones, larger one is distal at UVJ.  [CS]  0037 UA with some blood but no infection.  [CS]  [id number] Patient feeling better, pain well controlled. Provided urine strainer. Plan discharge with Rx for pain medications, flomax and Urology follow up. [facility]ED, preferably [facility], if pain is uncontrolled, runs a fever or for any other concerns. [CS]    Clinical Course User Index [CS] Roselyn Carlin NOVAK, MD     MDM Rules/Calculators/A&P Medical Decision Making Problems Addressed: Right ureteral stone: acute illness or injury  Amount and/or Complexity of Data Reviewed Labs: ordered. Decision-making details documented in ED Course. Radiology: ordered and independent interpretation performed. Decision-making details documented in ED Course.  Risk Prescription drug management. Parenteral controlled substances.     Final Clinical Impression(s) / ED Diagnoses Final diagnoses:  Right ureteral stone    Rx / DC Orders ED Discharge Orders          Ordered     HYDROcodone-acetaminophen  (NORCO/VICODIN) 5-325 MG tablet  Every 6 hours PRN        09/26/24 0057    tamsulosin (FLOMAX) 0.4 MG CAPS capsule  Daily        09/26/24 0057             Roselyn Carlin NOVAK, MD 09/26/24 [phone]

## 2024-09-25 NOTE — ED Notes (Signed)
 Pt transported to CT at this time.

## 2024-09-26 LAB — URINALYSIS, MICROSCOPIC (REFLEX)

## 2024-09-26 LAB — URINALYSIS, ROUTINE W REFLEX MICROSCOPIC
Bilirubin Urine: NEGATIVE
Glucose, UA: NEGATIVE mg/dL
Ketones, ur: NEGATIVE mg/dL
Leukocytes,Ua: NEGATIVE
Nitrite: NEGATIVE
Protein, ur: 100 mg/dL — AB
Specific Gravity, Urine: >=1.03 (ref 1.005–1.030)
pH: 5.5 (ref 5.0–8.0)

## 2024-09-26 MED ORDER — IOHEXOL 300 MG/ML  SOLN
100.0000 mL | Freq: Once | INTRAMUSCULAR | Status: AC | PRN
  Administered 2024-09-26: 100 mL via INTRAVENOUS

## 2024-09-26 MED ORDER — TAMSULOSIN HCL 0.4 MG PO CAPS: 0.4000 mg | ORAL_CAPSULE | Freq: Every day | ORAL | 0 refills | Status: AC

## 2024-09-26 MED ORDER — HYDROCODONE-ACETAMINOPHEN 5-325 MG PO TABS: 1.0000 | ORAL_TABLET | Freq: Four times a day (QID) | ORAL | 0 refills | Status: DC | PRN

## 2024-09-26 NOTE — Discharge Instructions (Addendum)
 You have two kidney stones in your R ureter and others in the kidney itself. Please strain all urine until you have passed the stones. Please use caution while taking the Norco and Flomax as they may cause drowsiness, dizziness and lower blood pressure.

## 2024-09-28 ENCOUNTER — Other Ambulatory Visit: Payer: Self-pay | Admitting: Urology

## 2024-09-28 NOTE — Progress Notes (Signed)
 For Anesthesia: PCP - Searcy Overcast, MD  Cardiologist - Burnard Debby LABOR, MD   Bowel Prep reminder: N/A  Chest x-ray - greater than 1 year EKG - 05/26/24 in [facility] Stress Test - 05/17/24 in [facility] ECHO - 03/29/24 in [facility] Cardiac Cath -  N/A Pacemaker/ICD device last checked: N/A Pacemaker orders received: N/A Device Rep notified: N/A  Spinal Cord Stimulator: N/A  Sleep Study - N/A CPAP - N/A  Fasting Blood Sugar - N/A Checks Blood Sugar ___N/A__ times a day Date and result of last Hgb A1c-N/A  Last dose of GLP1 agonist- N/A GLP1 instructions: Hold 7 days prior to schedule (Hold 24 hours-daily)   Last dose of SGLT-2 inhibitors- N/A SGLT-2 instructions: Hold 72 hours prior to surgery  Blood Thinner Instructions:N/A Last Dose:N/A Time last taken:N/A  Aspirin Instructions: Yes Last Dose: 09/27/24 Time last taken: 700AM  Activity level: Can go up a flight of stairs and activities of daily living without stopping and without chest pain and/or shortness of breath    Anesthesia review: Harlene Sink reviewed  Patient denies shortness of breath, fever, cough and chest pain at PAT appointment   Patient verbalized understanding of instructions that were reviewed over the telephone.

## 2024-09-29 ENCOUNTER — Other Ambulatory Visit: Payer: Self-pay

## 2024-09-29 ENCOUNTER — Encounter (HOSPITAL_COMMUNITY): Payer: Self-pay | Admitting: Urology

## 2024-09-29 NOTE — Progress Notes (Signed)
 Sent message, via epic in basket, requesting orders in epic from Careers adviser.

## 2024-09-30 ENCOUNTER — Other Ambulatory Visit: Payer: Self-pay

## 2024-09-30 ENCOUNTER — Ambulatory Visit (HOSPITAL_COMMUNITY): Admitting: Anesthesiology

## 2024-09-30 ENCOUNTER — Ambulatory Visit (HOSPITAL_COMMUNITY)

## 2024-09-30 ENCOUNTER — Encounter (HOSPITAL_COMMUNITY): Payer: Self-pay | Admitting: Urology

## 2024-09-30 ENCOUNTER — Encounter (HOSPITAL_COMMUNITY): Admission: RE | Disposition: A | Payer: Self-pay | Source: Home / Self Care | Attending: Urology

## 2024-09-30 ENCOUNTER — Ambulatory Visit (HOSPITAL_COMMUNITY)
Admission: RE | Admit: 2024-09-30 | Discharge: 2024-09-30 | Disposition: A | Discharge status: Home / Self Care | Source: Ambulatory Visit | Attending: Urology | Admitting: Urology

## 2024-09-30 DIAGNOSIS — Z87891 Personal history of nicotine dependence: Secondary | ICD-10-CM | POA: Insufficient documentation

## 2024-09-30 DIAGNOSIS — N201 Calculus of ureter: Secondary | ICD-10-CM | POA: Diagnosis not present

## 2024-09-30 DIAGNOSIS — Z79899 Other long term (current) drug therapy: Secondary | ICD-10-CM | POA: Diagnosis not present

## 2024-09-30 DIAGNOSIS — N210 Calculus in bladder: Secondary | ICD-10-CM | POA: Insufficient documentation

## 2024-09-30 DIAGNOSIS — I10 Essential (primary) hypertension: Secondary | ICD-10-CM | POA: Diagnosis not present

## 2024-09-30 DIAGNOSIS — K219 Gastro-esophageal reflux disease without esophagitis: Secondary | ICD-10-CM | POA: Diagnosis not present

## 2024-09-30 HISTORY — DX: Bifascicular block: I45.2

## 2024-09-30 HISTORY — PX: CYSTOSCOPY/URETEROSCOPY/HOLMIUM LASER/STENT PLACEMENT: SHX6546

## 2024-09-30 HISTORY — DX: Unspecified hearing loss, unspecified ear: H91.90

## 2024-09-30 HISTORY — DX: Dyspnea, unspecified: R06.00

## 2024-09-30 HISTORY — DX: Presence of external hearing-aid: Z97.4

## 2024-09-30 LAB — BASIC METABOLIC PANEL WITH GFR
Anion gap: 10 (ref 5–15)
BUN: 11 mg/dL (ref 8–23)
CO2: 26 mmol/L (ref 22–32)
Calcium: 8.7 mg/dL — ABNORMAL LOW (ref 8.9–10.3)
Chloride: 106 mmol/L (ref 98–111)
Creatinine, Ser: 0.99 mg/dL (ref 0.61–1.24)
GFR, Estimated: >60 mL/min (ref >60)
Glucose, Bld: 105 mg/dL — ABNORMAL HIGH (ref 70–99)
Potassium: 3.9 mmol/L (ref 3.5–5.1)
Sodium: 142 mmol/L (ref 135–145)

## 2024-09-30 SURGERY — CYSTOSCOPY/URETEROSCOPY/HOLMIUM LASER/STENT PLACEMENT
Anesthesia: General | Site: Ureter | Laterality: Right

## 2024-09-30 MED ORDER — LIDOCAINE HCL (PF) 2 % IJ SOLN
INTRAMUSCULAR | Status: AC
  Filled 2024-09-30: qty 5

## 2024-09-30 MED ORDER — FENTANYL CITRATE (PF) 50 MCG/ML IJ SOSY: 25.0000 ug | PREFILLED_SYRINGE | INTRAMUSCULAR | Status: DC | PRN

## 2024-09-30 MED ORDER — OXYCODONE HCL 5 MG/5ML PO SOLN: 5.0000 mg | Freq: Once | ORAL | Status: DC | PRN

## 2024-09-30 MED ORDER — FENTANYL CITRATE (PF) 100 MCG/2ML IJ SOLN
INTRAMUSCULAR | Status: DC | PRN
  Administered 2024-09-30: 50 ug via INTRAVENOUS

## 2024-09-30 MED ORDER — DEXAMETHASONE SOD PHOSPHATE PF 10 MG/ML IJ SOLN
INTRAMUSCULAR | Status: DC | PRN
  Administered 2024-09-30: 5 mg via INTRAVENOUS

## 2024-09-30 MED ORDER — AMISULPRIDE (ANTIEMETIC) 5 MG/2ML IV SOLN: 10.0000 mg | Freq: Once | INTRAVENOUS | Status: DC | PRN

## 2024-09-30 MED ORDER — CEFAZOLIN SODIUM-DEXTROSE 2-4 GM/100ML-% IV SOLN
2.0000 g | INTRAVENOUS | Status: AC
  Administered 2024-09-30: 2 g via INTRAVENOUS
  Filled 2024-09-30: qty 100

## 2024-09-30 MED ORDER — PROPOFOL 10 MG/ML IV BOLUS
INTRAVENOUS | Status: DC | PRN
  Administered 2024-09-30: 150 mg via INTRAVENOUS

## 2024-09-30 MED ORDER — SULFAMETHOXAZOLE-TRIMETHOPRIM 800-160 MG PO TABS: 1.0000 | ORAL_TABLET | Freq: Two times a day (BID) | ORAL | 0 refills | Status: AC

## 2024-09-30 MED ORDER — PROPOFOL 10 MG/ML IV BOLUS
INTRAVENOUS | Status: AC
  Filled 2024-09-30: qty 20

## 2024-09-30 MED ORDER — LACTATED RINGERS IV SOLN: INTRAVENOUS | Status: DC

## 2024-09-30 MED ORDER — HYDROCODONE-ACETAMINOPHEN 5-325 MG PO TABS: 1.0000 | ORAL_TABLET | Freq: Four times a day (QID) | ORAL | 0 refills | Status: AC | PRN

## 2024-09-30 MED ORDER — ONDANSETRON HCL 4 MG/2ML IJ SOLN
INTRAMUSCULAR | Status: DC | PRN
  Administered 2024-09-30: 4 mg via INTRAVENOUS

## 2024-09-30 MED ORDER — EPHEDRINE 5 MG/ML INJ
INTRAVENOUS | Status: AC
  Filled 2024-09-30: qty 5

## 2024-09-30 MED ORDER — SODIUM CHLORIDE 0.9 % IR SOLN
Status: DC | PRN
  Administered 2024-09-30: 3000 mL via INTRAVESICAL

## 2024-09-30 MED ORDER — EPHEDRINE SULFATE-NACL 50-0.9 MG/10ML-% IV SOSY
PREFILLED_SYRINGE | INTRAVENOUS | Status: DC | PRN
  Administered 2024-09-30: 5 mg via INTRAVENOUS

## 2024-09-30 MED ORDER — IOHEXOL 300 MG/ML  SOLN
INTRAMUSCULAR | Status: DC | PRN
  Administered 2024-09-30: 10 mL

## 2024-09-30 MED ORDER — LIDOCAINE HCL (CARDIAC) PF 100 MG/5ML IV SOSY
PREFILLED_SYRINGE | INTRAVENOUS | Status: DC | PRN
  Administered 2024-09-30: 80 mg via INTRATRACHEAL

## 2024-09-30 MED ORDER — ORAL CARE MOUTH RINSE: 15.0000 mL | Freq: Once | OROMUCOSAL | Status: DC

## 2024-09-30 MED ORDER — ONDANSETRON HCL 4 MG/2ML IJ SOLN
INTRAMUSCULAR | Status: AC
  Filled 2024-09-30: qty 2

## 2024-09-30 MED ORDER — OXYCODONE HCL 5 MG PO TABS: 5.0000 mg | ORAL_TABLET | Freq: Once | ORAL | Status: DC | PRN

## 2024-09-30 MED ORDER — PHENYLEPHRINE 80 MCG/ML (10ML) SYRINGE FOR IV PUSH (FOR BLOOD PRESSURE SUPPORT)
PREFILLED_SYRINGE | INTRAVENOUS | Status: DC | PRN
  Administered 2024-09-30: 120 ug via INTRAVENOUS
  Administered 2024-09-30: 80 ug via INTRAVENOUS
  Administered 2024-09-30: 120 ug via INTRAVENOUS

## 2024-09-30 MED ORDER — CHLORHEXIDINE GLUCONATE 0.12 % MT SOLN: 15.0000 mL | Freq: Once | OROMUCOSAL | Status: DC

## 2024-09-30 MED ORDER — FENTANYL CITRATE (PF) 100 MCG/2ML IJ SOLN
INTRAMUSCULAR | Status: AC
  Filled 2024-09-30: qty 2

## 2024-09-30 MED ORDER — ACETAMINOPHEN 500 MG PO TABS
1000.0000 mg | ORAL_TABLET | Freq: Once | ORAL | Status: AC
  Administered 2024-09-30: 1000 mg via ORAL
  Filled 2024-09-30: qty 2

## 2024-09-30 SURGICAL SUPPLY — 19 items
BAG URO CATCHER STRL LF (MISCELLANEOUS) ×1 IMPLANT
BASKET LASER NITINOL 1.9FR (BASKET) IMPLANT
BASKET ZERO TIP NITINOL 2.4FR (BASKET) IMPLANT
CATH URETERAL DUAL LUMEN 10F (MISCELLANEOUS) IMPLANT
CATH URETL OPEN END 6FR 70 (CATHETERS) ×1 IMPLANT
CLOTH BEACON ORANGE TIMEOUT ST (SAFETY) ×1 IMPLANT
GLOVE BIO SURGEON STRL SZ7.5 (GLOVE) ×1 IMPLANT
GOWN STRL REUS W/ TWL XL LVL3 (GOWN DISPOSABLE) ×1 IMPLANT
GUIDEWIRE ANG ZIPWIRE 038X150 (WIRE) IMPLANT
GUIDEWIRE STR DUAL SENSOR (WIRE) ×1 IMPLANT
KIT TURNOVER KIT A (KITS) ×1 IMPLANT
MANIFOLD NEPTUNE II (INSTRUMENTS) ×1 IMPLANT
PACK CYSTO (CUSTOM PROCEDURE TRAY) ×1 IMPLANT
SHEATH NAVIGATOR HD 11/13X28 (SHEATH) IMPLANT
SHEATH NAVIGATOR HD 11/13X36 (SHEATH) IMPLANT
STENT URET 6FRX26 CONTOUR (STENTS) IMPLANT
TRACTIP FLEXIVA PULS ID 200XHI (Laser) IMPLANT
TUBING CONNECTING 10 (TUBING) ×1 IMPLANT
TUBING UROLOGY SET (TUBING) ×1 IMPLANT

## 2024-09-30 NOTE — Transfer of Care (Signed)
 Immediate Anesthesia Transfer of Care Note  Patient: [name] [facility]  Procedure(s) Performed: CYSTOSCOPY/URETEROSCOPY/HOLMIUM LASER/STENT PLACEMENT (Right)  Patient Location: PACU  Anesthesia Type:General  Level of Consciousness: awake  Airway & Oxygen Therapy: Patient Spontanous Breathing and Patient connected to face mask  Post-op Assessment: Report given to RN and Post -op Vital signs reviewed and stable  Post vital signs: Reviewed and stable  Last Vitals:  Vitals Value Taken Time  BP 111/79 09/30/24 16:07  Temp    Pulse 64 09/30/24 16:09  Resp 7 09/30/24 16:09  SpO2 100 % 09/30/24 16:09  Vitals shown include unfiled device data.  Last Pain:  Vitals:   09/29/24 1014  TempSrc: Oral  PainSc: 0-No pain         Complications: No notable events documented.

## 2024-09-30 NOTE — Anesthesia Procedure Notes (Signed)
 Procedure Name: LMA Insertion Date/Time: 09/30/2024 3:26 PM  Performed by: Judythe Tanda Aran, CRNAPre-anesthesia Checklist: Emergency Drugs available, Patient identified, Suction available and Patient being monitored Patient Re-evaluated:Patient Re-evaluated prior to induction Oxygen Delivery Method: Circle system utilized Preoxygenation: Pre-oxygenation with 100% oxygen Induction Type: IV induction Ventilation: Mask ventilation without difficulty LMA: LMA inserted LMA Size: 4.0 Number of attempts: 1 Placement Confirmation: positive ETCO2 and breath sounds checked- equal and bilateral Tube secured with: Tape Dental Injury: Teeth and Oropharynx as per pre-operative assessment

## 2024-09-30 NOTE — Anesthesia Preprocedure Evaluation (Addendum)
 Anesthesia Evaluation  Patient identified by MRN, date of birth, ID band Patient awake    Reviewed: Allergy & Precautions, NPO status , Patient's Chart, lab work & pertinent test results, reviewed documented beta blocker date and time   Airway Mallampati: II  TM Distance: >3 FB Neck ROM: Full    Dental no notable dental hx. (+) Teeth Intact, Dental Advisory Given   Pulmonary former smoker   Pulmonary exam normal breath sounds clear to auscultation       Cardiovascular hypertension, Pt. on medications and Pt. on home beta blockers Normal cardiovascular exam+ Valvular Problems/Murmurs (mild/mod AI) AI  Rhythm:Regular Rate:Normal  TTE 2025  1. Left ventricular ejection fraction, by estimation, is 60 to 65%. Left  ventricular ejection fraction by PLAX is 61 %. The left ventricle has  normal function. The left ventricle has no regional wall motion  abnormalities. There is mild left ventricular  hypertrophy of the septal segment. Left ventricular diastolic parameters  are consistent with Grade I diastolic dysfunction (impaired relaxation).  Elevated left ventricular end-diastolic pressure.   2. Right ventricular systolic function is normal. The right ventricular  size is normal. There is normal pulmonary artery systolic pressure.   3. Left atrial size was mildly dilated.   4. The mitral valve is normal in structure. Mild mitral valve  regurgitation. No evidence of mitral stenosis.   5. The aortic valve is tricuspid. Aortic valve regurgitation is mild to  moderate. No aortic stenosis is present.   6. The inferior vena cava is normal in size with greater than 50%  respiratory variability, suggesting right atrial pressure of 3 mmHg.     Neuro/Psych negative neurological ROS  negative psych ROS   GI/Hepatic Neg liver ROS, hiatal hernia, PUD,GERD  ,,  Endo/Other  negative endocrine ROS    Renal/GU negative Renal ROS  negative  genitourinary   Musculoskeletal negative musculoskeletal ROS (+)    Abdominal   Peds  Hematology negative hematology ROS (+)   Anesthesia Other Findings   Reproductive/Obstetrics                              Anesthesia Physical Anesthesia Plan  ASA: 3  Anesthesia Plan: General   Post-op Pain Management: Tylenol  PO (pre-op)*   Induction: Intravenous  PONV Risk Score and Plan: 2 and Ondansetron, Dexamethasone and Treatment may vary due to age or medical condition  Airway Management Planned: LMA  Additional Equipment:   Intra-op Plan:   Post-operative Plan: Extubation in OR  Informed Consent: I have reviewed the patients History and Physical, chart, labs and discussed the procedure including the risks, benefits and alternatives for the proposed anesthesia with the patient or authorized representative who has indicated his/her understanding and acceptance.     Dental advisory given  Plan Discussed with: CRNA  Anesthesia Plan Comments:          Anesthesia Quick Evaluation

## 2024-09-30 NOTE — Anesthesia Postprocedure Evaluation (Signed)
 Anesthesia Post Note  Patient: DELVIS KAU  Procedure(s) Performed: CYSTOSCOPY/URETEROSCOPY/HOLMIUM LASER/STENT PLACEMENT (Right: Ureter)     Patient location during evaluation: PACU Anesthesia Type: General Level of consciousness: awake and alert Pain management: pain level controlled Vital Signs Assessment: post-procedure vital signs reviewed and stable Respiratory status: spontaneous breathing, nonlabored ventilation, respiratory function stable and patient connected to nasal cannula oxygen Cardiovascular status: blood pressure returned to baseline and stable Postop Assessment: no apparent nausea or vomiting Anesthetic complications: no   No notable events documented.  Last Vitals:  Vitals:   09/30/24 1630 09/30/24 1649  BP: 123/79 123/65  Pulse: 73 75  Resp: 13 20  Temp:  36.6 C  SpO2: 96% 97%    Last Pain:  Vitals:   09/30/24 1649  TempSrc: Oral  PainSc: 0-No pain                 Shavone Nevers L Jacey Eckerson

## 2024-09-30 NOTE — H&P (Signed)
 H&P  Chief Complaint: Right ureteral calculi  History of Present Illness: 83 year old male with 2 right ureteral calculi 1 in the mid ureter and the other at the ureterovesicular junction presents for ureteroscopy.  Past Medical History:  Diagnosis Date   Allergy    Appendicitis    Bifascicular block    Cholecystitis    Cholelithiasis    Diverticulosis    Dyspnea    Gallstones    GERD (gastroesophageal reflux disease)    H pylori ulcer    Hemorrhoids    Hiatal hernia    HOH (hard of hearing)    Hyperlipidemia    Hypertension    Kidney stones    Peptic ulcer    history of   RBBB (right bundle branch block with left anterior fascicular block)    Wears hearing aid in both ears    Past Surgical History:  Procedure Laterality Date   APPENDECTOMY     CHOLECYSTECTOMY     COLONOSCOPY     INGUINAL HERNIA REPAIR     left   KIDNEY STONE SURGERY     lithotripsy and removal of kidney stone   UMBILICAL HERNIA REPAIR      Home Medications:  Facility-Administered Medications Prior to Admission  Medication Dose Route Frequency Provider Last Rate Last Admin   0.9 %  sodium chloride  infusion  500 mL Intravenous Once Abran Norleen SAILOR, MD       Medications Prior to Admission  Medication Sig Dispense Refill Last Dose/Taking   Ascorbic Acid (VITAMIN C PO) Take 500 mg by mouth daily.    Taking   aspirin EC 81 MG tablet Take 81 mg by mouth daily.   09/28/2024   Azelastine-Fluticasone (DYMISTA) 137-50 MCG/ACT SUSP Place 1 spray into both nostrils daily as needed (Seasonal allergies).   Taking As Needed   calcium  carbonate (TUMS - DOSED IN MG ELEMENTAL CALCIUM ) 500 MG chewable tablet Chew 1 tablet by mouth daily as needed for indigestion or heartburn.   Taking As Needed   Cholecalciferol 25 MCG (1000 UT) tablet Take 1,000 Units by mouth daily.   Taking   Cyanocobalamin (VITAMIN B-12) 5000 MCG TBDP Take 5,000 mcg by mouth daily.   Taking   DANDELION ROOT PO Take 1 tablet by mouth daily.    Taking   escitalopram (LEXAPRO) 5 MG tablet Take 5 mg by mouth every evening.   Taking   esomeprazole (NEXIUM) 40 MG capsule Take 40 mg by mouth daily.   Taking   HYDROcodone-acetaminophen  (NORCO/VICODIN) 5-325 MG tablet Take 1 tablet by mouth every 6 (six) hours as needed for severe pain (pain score 7-10). 12 tablet 0 Taking As Needed   metoprolol  succinate (TOPROL -XL) 25 MG 24 hr tablet Take 1 tablet (25 mg total) by mouth daily. 90 tablet 2 09/30/2024 Morning   Multiple Vitamins-Minerals (OCUVITE PO) Take 1 tablet by mouth daily at 6 (six) AM.   Taking   olmesartan (BENICAR) 20 MG tablet Take 10 mg by mouth daily.   Taking   omeprazole (PRILOSEC OTC) 20 MG tablet Take 20 mg by mouth daily.   Taking   pantoprazole (PROTONIX) 40 MG tablet Take 40 mg by mouth daily.   Taking   Polyethyl Glycol-Propyl Glycol (SYSTANE) 0.4-0.3 % SOLN Place 1 drop into both eyes daily as needed (dry eyes).   Taking As Needed   rosuvastatin  (CRESTOR ) 20 MG tablet Take 1 tablet (20 mg total) by mouth daily. 90 tablet 4 Taking   tamsulosin (FLOMAX) 0.4  MG CAPS capsule Take 1 capsule (0.4 mg total) by mouth daily. 15 capsule 0 Taking   traZODone (DESYREL) 50 MG tablet Take 50 mg by mouth at bedtime as needed for sleep.   Taking As Needed   Allergies:  Allergies  Allergen Reactions   Aspirin     Other Reaction(s): Unknown   Escitalopram Other (See Comments)    Other Reaction(s): vivid dreams, nighmares Patient currently taking   Penicillins Rash    Family History  Problem Relation Age of Onset   CAD Mother    Hypertension Mother    CVA Mother    Diabetes Mother        grandparents   Heart disease Mother    CVA Other        grandparents   Fibromyalgia Daughter    Hypertension Son    Stomach cancer Paternal Grandfather    Diabetes Father    Diabetes Other        siblings x6   Heart attack Brother    Diabetes Brother    Heart disease Brother    Stroke Maternal Grandmother    Stroke Paternal  Grandmother    Diabetes Sister    Heart attack Brother    Diabetes Brother    Heart disease Brother    Heart disease Brother    Heart disease Brother    Colon cancer Neg Hx    Social History:  reports that he quit smoking about 45 years ago. His smoking use included cigarettes. He quit smokeless tobacco use about 48 years ago. He reports current alcohol use. He reports that he does not use drugs.  ROS: A complete review of systems was performed.  All systems are negative except for pertinent findings as noted. ROS   Physical Exam:  Vital signs in last 24 hours: Weight:  [95.3 kg] 95.3 kg (10/31 1333) General:  Alert and oriented, No acute distress HEENT: Normocephalic, atraumatic Neck: No JVD or lymphadenopathy Cardiovascular: Regular rate and rhythm Lungs: Regular rate and effort Abdomen: Soft, nontender, nondistended, no abdominal masses Back: No CVA tenderness Extremities: No edema Neurologic: Grossly intact  Laboratory Data:  No results found for this or any previous visit (from the past 24 hours). No results found for this or any previous visit (from the past 240 hours). Creatinine: Recent Labs    09/25/24 2238  CREATININE 1.17    Impression/Assessment:  Right ureteral calculi  Plan:  Proceed with ureteroscopy.  Risk benefits discussed including but not limited to bleeding, infection, injury to surrounding structures including potential for ureteral avulsion, need for additional procedures, need for a stent, stent discomfort among other imponderables  Sherwood JONETTA Edison, III 09/30/2024, 2:50 PM

## 2024-09-30 NOTE — Discharge Instructions (Signed)

## 2024-09-30 NOTE — Op Note (Addendum)
 Operative Note  Preoperative diagnosis:  1.  Right ureteral calculus  Postoperative diagnosis: 1.  Right ureteral calculus 2.  Bladder calculus less than 2.5 cm  Procedure(s): 1.  Cystoscopy with right retrograde pyelogram, right ureteroscopy with laser lithotripsy and stone basketing, ureteral stent placement 2.  Cystolitholapaxy less than 2.5 cm  Surgeon: Sherwood Edison, MD  Assistants: None  Anesthesia: General  Complications: None immediate  EBL: Minimal  Specimens: 1.  Renal calculi  Drains/Catheters: 1.  6 x 26 double-J ureteral stent  Intraoperative findings: 1.  Normal anterior urethra 2.  Moderately obstructing prostate 3.  Bladder mucosa with approximately 1.2 cm bladder calculus unable to be extracted through the scope that was laser fragmented to smaller fragments. 4.  Distal right ureteral calculus fragmented and basket extracted.  No other calculi along the ureter.   5.  Right retrograde pyelogram revealed no obvious hydronephrosis and no filling defect after treatment of the stone.  Indication: 83 year old male with right ureteral calculi presents for the previously mentioned operation.  Description of procedure:  The patient was identified and consent was obtained.  The patient was taken to the operating room and placed in the supine position.  The patient was placed under general anesthesia.  Perioperative antibiotics were administered.  The patient was placed in dorsal lithotomy.  Patient was prepped and draped in a standard sterile fashion and a timeout was performed.  A 21 French rigid cystoscope was advanced into the urethra and into the bladder.  Complete cystoscopy was performed with the findings noted above.  Right ureter was cannulated with a sensor wire which was advanced up to the kidney under fluoroscopic guidance.  Semirigid ureteroscopy was performed alongside the wire up to the stone which was laser fragmented to smaller fragments followed by basket  extraction.  I then advanced the scope up to the renal pelvis and no other calculi were seen.  I shot a retrograde pyelogram through the scope with the findings noted above.  I withdrew the scope visualizing the ureter upon removal and there was no obvious ureteral injury and no ureteral calculi were seen.  Distal ureter was a little bit tight so I chose not to advance a second wire and the sheath up to the kidney to address the 2 small nonobstructing renal calculi that would likely pass on their own.  I backloaded the wire onto rigid cystoscope and advanced that into the bladder followed by routine placement of a 6 x 26 double-J ureteral stent.  Fluoroscopy confirmed proximal placement and direct visualization confirmed a good coil within the bladder.  I tried to extract the stone in the bladder through the scope but it was too large.  I advanced a laser fiber through the cystoscope and fragmented that stone to smaller fragments followed by extraction through the scope.  All stone was removed.  I drained the bladder withdrew the scope.  Patient tolerated the procedure well was stable postoperatively.   Plan: Follow-up in 1 week for stent removal

## 2024-10-01 ENCOUNTER — Encounter (HOSPITAL_COMMUNITY): Payer: Self-pay | Admitting: Urology

## 2024-10-30 NOTE — Progress Notes (Unsigned)
 Cardiology Office Note:    Date:  11/02/2024   ID:  [name], DOB February 12, 1941, MRN [id number]  PCP:  Janey Santos, MD  Cardiologist:  Lonni LITTIE Nanas, MD  Electrophysiologist:  None   Referring MD: Janey Santos, MD   Chief Complaint  Patient presents with   Coronary Artery Disease    History of Present Illness:    [name] is a 83 y.o. male with a hx of CAD, hypertension, hyperlipidemia who presents for follow-up.  He previously followed with Dr. Burnard.  Coronary CTA 11/23/2018 showed nonobstructive CAD, calcium  score 12 (8th percentile).  Echocardiogram 03/09/2024 showed EF 60 to 65%, normal RV function, mild to moderate aortic regurgitation.  Stress PET 05/17/2024 showed normal perfusion, normal myocardial blood flow reserve, LVEF 60%, mild coronary calcifications.  Since last clinic visit, he reports he is doing well. Denies any chest pain, lower extremity edema, or palpitations.  Does report some dyspnea with significant exertion.  He does not exercise but stays active, does work around the house.  Reports some lightheadedness in the morning, denies any syncope.    Past Medical History:  Diagnosis Date   Allergy    Appendicitis    Bifascicular block    Cholecystitis    Cholelithiasis    Diverticulosis    Dyspnea    Gallstones    GERD (gastroesophageal reflux disease)    H pylori ulcer    Hemorrhoids    Hiatal hernia    HOH (hard of hearing)    Hyperlipidemia    Hypertension    Kidney stones    Peptic ulcer    history of   RBBB (right bundle branch block with left anterior fascicular block)    Wears hearing aid in both ears     Past Surgical History:  Procedure Laterality Date   APPENDECTOMY     CHOLECYSTECTOMY     COLONOSCOPY     CYSTOSCOPY/URETEROSCOPY/HOLMIUM LASER/STENT PLACEMENT Right 09/30/2024   Procedure: CYSTOSCOPY/URETEROSCOPY/HOLMIUM LASER/STENT PLACEMENT;  Surgeon: Carolee Sherwood JONETTA DOUGLAS, MD;  Location: [facility];  Service:  Urology;  Laterality: Right;   INGUINAL HERNIA REPAIR     left   KIDNEY STONE SURGERY     lithotripsy and removal of kidney stone   UMBILICAL HERNIA REPAIR      Current Medications: Current Meds  Medication Sig   Ascorbic Acid (VITAMIN C PO) Take 500 mg by mouth daily.    aspirin EC 81 MG tablet Take 81 mg by mouth daily.   Azelastine-Fluticasone (DYMISTA) 137-50 MCG/ACT SUSP Place 1 spray into both nostrils daily as needed (Seasonal allergies).   calcium  carbonate (TUMS - DOSED IN MG ELEMENTAL CALCIUM ) 500 MG chewable tablet Chew 1 tablet by mouth daily as needed for indigestion or heartburn.   Cholecalciferol 25 MCG (1000 UT) tablet Take 1,000 Units by mouth daily.   Cyanocobalamin (VITAMIN B-12) 5000 MCG TBDP Take 5,000 mcg by mouth daily.   DANDELION ROOT PO Take 1 tablet by mouth daily.   escitalopram (LEXAPRO) 5 MG tablet Take 5 mg by mouth every evening.   esomeprazole (NEXIUM) 40 MG capsule Take 40 mg by mouth daily.   metoprolol  succinate (TOPROL -XL) 25 MG 24 hr tablet Take 1 tablet (25 mg total) by mouth daily.   Multiple Vitamins-Minerals (OCUVITE PO) Take 1 tablet by mouth daily at 6 (six) AM.   olmesartan (BENICAR) 20 MG tablet Take 10 mg by mouth daily.   omeprazole (PRILOSEC OTC) 20 MG tablet Take 20 mg by  mouth daily.   pantoprazole (PROTONIX) 40 MG tablet Take 40 mg by mouth daily.   Polyethyl Glycol-Propyl Glycol (SYSTANE) 0.4-0.3 % SOLN Place 1 drop into both eyes daily as needed (dry eyes).   rosuvastatin  (CRESTOR ) 20 MG tablet Take 1 tablet (20 mg total) by mouth daily.   traZODone (DESYREL) 50 MG tablet Take 50 mg by mouth at bedtime as needed for sleep.   Current Facility-Administered Medications for the 11/02/24 encounter (Office Visit) with Kate Lonni CROME, MD  Medication   0.9 %  sodium chloride  infusion     Allergies:   Aspirin, Escitalopram, and Penicillins   Social History   Socioeconomic History   Marital status: Married    Spouse name: Not  on file   Number of children: 2   Years of education: Not on file   Highest education level: Not on file  Occupational History   Occupation: retired  Tobacco Use   Smoking status: Former    Current packs/day: 0.00    Types: Cigarettes    Quit date: 12/01/1978    Years since quitting: 45.9   Smokeless tobacco: Former    Quit date: 12/02/1975  Vaping Use   Vaping status: Never Used  Substance and Sexual Activity   Alcohol use: Yes    Comment: ocassional wine  and beer   Drug use: No   Sexual activity: Not on file  Other Topics Concern   Not on file  Social History Narrative   Not on file   Social Drivers of Health   Financial Resource Strain: Not on file  Food Insecurity: Not on file  Transportation Needs: Not on file  Physical Activity: Not on file  Stress: Not on file  Social Connections: Not on file     Family History: The patient's family history includes CAD in his mother; CVA in his mother and another family member; Diabetes in his brother, brother, father, mother, sister, and another family member; Fibromyalgia in his daughter; Heart attack in his brother and brother; Heart disease in his brother, brother, brother, brother, and mother; Hypertension in his mother and son; Stomach cancer in his paternal grandfather; Stroke in his maternal grandmother and paternal grandmother. There is no history of Colon cancer.  ROS:   Please see the history of present illness.     All other systems reviewed and are negative.  EKGs/Labs/Other Studies Reviewed:    The following studies were reviewed today:   EKG:  EKG is not ordered today.    Recent Labs: 09/25/2024: ALT 25; Hemoglobin 14.4; Platelets 225 09/30/2024: BUN 11; Creatinine, Ser 0.99; Potassium 3.9; Sodium 142  Recent Lipid Panel    Component Value Date/Time   CHOL 102 05/03/2019 0937   TRIG 66 05/03/2019 0937   HDL 40 05/03/2019 0937   CHOLHDL 2.6 05/03/2019 0937   LDLCALC 49 05/03/2019 0937    Physical Exam:     VS:  BP 122/74 (BP Location: Left Arm, Patient Position: Sitting, Cuff Size: Normal)   Pulse 74   Ht 5' 10.39 (1.788 m)   Wt 205 lb 4.8 oz (93.1 kg)   SpO2 99%   BMI 29.13 kg/m     Wt Readings from Last 3 Encounters:  11/02/24 205 lb 4.8 oz (93.1 kg)  09/30/24 210 lb (95.3 kg)  09/25/24 210 lb (95.3 kg)     GEN:  Well nourished, well developed in no acute distress HEENT: Normal NECK: No JVD; No carotid bruits LYMPHATICS: No lymphadenopathy CARDIAC: RRR, no  murmurs, rubs, gallops RESPIRATORY:  Clear to auscultation without rales, wheezing or rhonchi  ABDOMEN: Soft, non-tender, non-distended MUSCULOSKELETAL:  No edema; No deformity  SKIN: Warm and dry NEUROLOGIC:  Alert and oriented x 3 PSYCHIATRIC:  Normal affect   ASSESSMENT:    1. Coronary artery disease involving native coronary artery of native heart without angina pectoris   2. Nonrheumatic aortic valve insufficiency   3. Essential hypertension   4. Hyperlipidemia, unspecified hyperlipidemia type    PLAN:    CAD: Coronary CTA 11/23/2018 showed nonobstructive CAD, calcium  score 12 (8th percentile).  Echocardiogram 03/09/2024 showed EF 60 to 65%, normal RV function, mild to moderate aortic regurgitation.  Stress PET 05/17/2024 showed normal perfusion, normal myocardial blood flow reserve, LVEF 60%, mild coronary calcifications. - Continue rosuvastatin  20 mg daily  Aortic regurgitation: Mild to moderate AI on echocardiogram 03/2024, will monitor  Hyperlipidemia: Continue rosuvastatin  20 mg daily.  LDL 38  Hypertension: Continue olmesartan 10 mg daily.  Appears controlled  RTC in 1 year    Medication Adjustments/Labs and Tests Ordered: Current medicines are reviewed at length with the patient today.  Concerns regarding medicines are outlined above.  No orders of the defined types were placed in this encounter.  No orders of the defined types were placed in this encounter.   Patient Instructions  Medication  Instructions:  Your physician recommends that you continue on your current medications as directed. Please refer to the Current Medication list given to you today.  *If you need a refill on your cardiac medications before your next appointment, please call your pharmacy*  Lab Work: none If you have labs (blood work) drawn today and your tests are completely normal, you will receive your results only by: MyChart Message (if you have MyChart) OR A paper copy in the mail If you have any lab test that is abnormal or we need to change your treatment, we will call you to review the results.  Testing/Procedures: none  Follow-Up: At [facility], you and your health needs are our priority.  As part of our continuing mission to provide you with exceptional heart care, our providers are all part of one team.  This team includes your primary Cardiologist (physician) and Advanced Practice Providers or APPs (Physician Assistants and Nurse Practitioners) who all work together to provide you with the care you need, when you need it.  Your next appointment:   1 year  Provider:   Dr. KATE  We recommend signing up for the patient portal called MyChart.  Sign up information is provided on this After Visit Summary.  MyChart is used to connect with patients for Virtual Visits (Telemedicine).  Patients are able to view lab/test results, encounter notes, upcoming appointments, etc.  Non-urgent messages can be sent to your provider as well.   To learn more about what you can do with MyChart, go to [email or web address].   Other Instructions NONE           Signed, Lonni LITTIE Kate, MD  11/02/2024 8:35 AM    [facility]

## 2024-11-02 ENCOUNTER — Ambulatory Visit: Attending: Cardiology | Admitting: Cardiology

## 2024-11-02 VITALS — BP 122/74 | HR 74 | Ht 70.39 in | Wt 205.3 lb

## 2024-11-02 DIAGNOSIS — I351 Nonrheumatic aortic (valve) insufficiency: Secondary | ICD-10-CM

## 2024-11-02 DIAGNOSIS — E785 Hyperlipidemia, unspecified: Secondary | ICD-10-CM | POA: Diagnosis not present

## 2024-11-02 DIAGNOSIS — I2510 Atherosclerotic heart disease of native coronary artery without angina pectoris: Secondary | ICD-10-CM | POA: Diagnosis not present

## 2024-11-02 DIAGNOSIS — I10 Essential (primary) hypertension: Secondary | ICD-10-CM

## 2024-11-02 NOTE — Patient Instructions (Signed)
 Medication Instructions:  Your physician recommends that you continue on your current medications as directed. Please refer to the Current Medication list given to you today.  *If you need a refill on your cardiac medications before your next appointment, please call your pharmacy*  Lab Work: none If you have labs (blood work) drawn today and your tests are completely normal, you will receive your results only by: MyChart Message (if you have MyChart) OR A paper copy in the mail If you have any lab test that is abnormal or we need to change your treatment, we will call you to review the results.  Testing/Procedures: none  Follow-Up: At [facility], you and your health needs are our priority.  As part of our continuing mission to provide you with exceptional heart care, our providers are all part of one team.  This team includes your primary Cardiologist (physician) and Advanced Practice Providers or APPs (Physician Assistants and Nurse Practitioners) who all work together to provide you with the care you need, when you need it.  Your next appointment:   1 year  Provider:   Dr. KATE  We recommend signing up for the patient portal called MyChart.  Sign up information is provided on this After Visit Summary.  MyChart is used to connect with patients for Virtual Visits (Telemedicine).  Patients are able to view lab/test results, encounter notes, upcoming appointments, etc.  Non-urgent messages can be sent to your provider as well.   To learn more about what you can do with MyChart, go to [email or web address].   Other Instructions NONE
